# Patient Record
Sex: Female | Born: 1940 | Race: White | Hispanic: No | Marital: Married | Smoking: Never smoker
Health system: Southern US, Community
[De-identification: ages and names within clinical notes are randomized; demographics above are authoritative.]

## PROBLEM LIST (undated history)

## (undated) DIAGNOSIS — R351 Nocturia: Secondary | ICD-10-CM

## (undated) DIAGNOSIS — Q452 Congenital pancreatic cyst: Secondary | ICD-10-CM

## (undated) DIAGNOSIS — Z789 Other specified health status: Secondary | ICD-10-CM

## (undated) DIAGNOSIS — K219 Gastro-esophageal reflux disease without esophagitis: Secondary | ICD-10-CM

## (undated) DIAGNOSIS — M1990 Unspecified osteoarthritis, unspecified site: Secondary | ICD-10-CM

## (undated) DIAGNOSIS — Z960 Presence of urogenital implants: Secondary | ICD-10-CM

## (undated) DIAGNOSIS — M4802 Spinal stenosis, cervical region: Secondary | ICD-10-CM

## (undated) DIAGNOSIS — N189 Chronic kidney disease, unspecified: Secondary | ICD-10-CM

## (undated) DIAGNOSIS — K589 Irritable bowel syndrome without diarrhea: Secondary | ICD-10-CM

## (undated) DIAGNOSIS — N3941 Urge incontinence: Secondary | ICD-10-CM

## (undated) DIAGNOSIS — M810 Age-related osteoporosis without current pathological fracture: Secondary | ICD-10-CM

## (undated) HISTORY — PX: CARPAL TUNNEL RELEASE: SHX101

## (undated) HISTORY — PX: FRACTURE SURGERY: SHX138

## (undated) HISTORY — PX: EYE SURGERY: SHX253

## (undated) HISTORY — PX: TONSILLECTOMY: SUR1361

## (undated) HISTORY — PX: COLONOSCOPY: SHX174

## (undated) HISTORY — PX: OTHER SURGICAL HISTORY: SHX169

---

## 1965-09-28 HISTORY — PX: DILATION AND CURETTAGE OF UTERUS: SHX78

## 1999-09-29 HISTORY — PX: BLADDER SUSPENSION: SHX72

## 2003-09-29 HISTORY — PX: CYSTOCELE REPAIR: SHX163

## 2004-05-15 ENCOUNTER — Ambulatory Visit (HOSPITAL_COMMUNITY): Admission: RE | Admit: 2004-05-15 | Discharge: 2004-05-15 | Payer: Self-pay

## 2004-09-28 HISTORY — PX: CATARACT EXTRACTION W/ INTRAOCULAR LENS IMPLANT: SHX1309

## 2008-03-20 ENCOUNTER — Encounter: Admission: RE | Admit: 2008-03-20 | Discharge: 2008-03-20 | Payer: Self-pay | Admitting: Obstetrics and Gynecology

## 2008-06-20 ENCOUNTER — Ambulatory Visit: Payer: Self-pay | Admitting: Psychology

## 2008-06-20 ENCOUNTER — Encounter: Admission: RE | Admit: 2008-06-20 | Discharge: 2008-06-20 | Payer: Self-pay | Admitting: Neurology

## 2009-03-22 ENCOUNTER — Encounter: Admission: RE | Admit: 2009-03-22 | Discharge: 2009-03-22 | Payer: Self-pay | Admitting: Obstetrics and Gynecology

## 2009-04-28 HISTORY — PX: CARPAL TUNNEL RELEASE: SHX101

## 2010-01-20 ENCOUNTER — Encounter: Admission: RE | Admit: 2010-01-20 | Discharge: 2010-03-17 | Payer: Self-pay | Admitting: Sports Medicine

## 2010-03-26 ENCOUNTER — Encounter: Admission: RE | Admit: 2010-03-26 | Discharge: 2010-03-26 | Payer: Self-pay | Admitting: Obstetrics and Gynecology

## 2011-03-09 ENCOUNTER — Other Ambulatory Visit: Payer: Self-pay | Admitting: Internal Medicine

## 2011-03-09 DIAGNOSIS — Z1231 Encounter for screening mammogram for malignant neoplasm of breast: Secondary | ICD-10-CM

## 2011-04-08 ENCOUNTER — Ambulatory Visit
Admission: RE | Admit: 2011-04-08 | Discharge: 2011-04-08 | Disposition: A | Discharge status: Home / Self Care | Payer: Medicare Other | Source: Ambulatory Visit | Attending: Internal Medicine | Admitting: Internal Medicine

## 2011-04-08 DIAGNOSIS — Z1231 Encounter for screening mammogram for malignant neoplasm of breast: Secondary | ICD-10-CM

## 2011-08-11 DIAGNOSIS — Q452 Congenital pancreatic cyst: Secondary | ICD-10-CM | POA: Insufficient documentation

## 2011-11-09 ENCOUNTER — Other Ambulatory Visit: Payer: Self-pay | Admitting: Orthopedic Surgery

## 2011-11-20 ENCOUNTER — Encounter (HOSPITAL_COMMUNITY): Payer: Self-pay | Admitting: Pharmacy Technician

## 2011-11-20 ENCOUNTER — Encounter (HOSPITAL_COMMUNITY): Payer: Self-pay

## 2011-11-20 ENCOUNTER — Encounter (HOSPITAL_COMMUNITY)
Admission: RE | Admit: 2011-11-20 | Discharge: 2011-11-20 | Disposition: A | Discharge status: Home / Self Care | Payer: Medicare Other | Source: Ambulatory Visit | Attending: Orthopedic Surgery | Admitting: Orthopedic Surgery

## 2011-11-20 HISTORY — DX: Chronic kidney disease, unspecified: N18.9

## 2011-11-20 HISTORY — DX: Unspecified osteoarthritis, unspecified site: M19.90

## 2011-11-20 HISTORY — DX: Irritable bowel syndrome, unspecified: K58.9

## 2011-11-20 LAB — SURGICAL PCR SCREEN
MRSA, PCR: NEGATIVE
Staphylococcus aureus: POSITIVE — AB

## 2011-11-20 MED ORDER — CHLORHEXIDINE GLUCONATE 4 % EX LIQD: 60.0000 mL | Freq: Once | CUTANEOUS | Status: DC

## 2011-11-20 NOTE — Patient Instructions (Signed)
20 Alicia Bradley  11/20/2011   Your procedure is scheduled on:  11/26/11 315pm-515pm  Report to [facility] at 1245pm.  Call this number if you have problems the morning of surgery: [phone]   Remember:   Do not eat food:After Midnight.  May have clear liquids until 0830am then npo     Take these medicines the morning of surgery with A SIP OF WATER:    Do not wear jewelry, make-up or nail polish.  Do not wear lotions, powders, or perfumes.   Do not shave 48 hours prior to surgery.  Do not bring valuables to the hospital.  Contacts, dentures or bridgework may not be worn into surgery.     Patients discharged the day of surgery will not be allowed to drive home.  Name and phone number of your driver:   Special Instructions: CHG Shower Use Special Wash: 1/2 bottle night before surgery and 1/2 bottle morning of surgery. shower chin to toes with CHG.  Wash face and private parts with regular soap.     Please read over the following fact sheets that you were given: MRSA Information, coughing and deep breathing exercises, leg exercises

## 2011-11-23 NOTE — Pre-Procedure Instructions (Signed)
11/23/11 Left message on pt's home phone regarding surgery date now 11/25/11.  Time 600-800pm Arrival to Short Stay is 330pm.  PT may have clear liquids until 1130am.

## 2011-11-25 ENCOUNTER — Ambulatory Visit (HOSPITAL_COMMUNITY)
Admission: RE | Admit: 2011-11-25 | Discharge: 2011-11-25 | Disposition: A | Discharge status: Home / Self Care | Payer: Medicare Other | Source: Ambulatory Visit | Attending: Orthopedic Surgery | Admitting: Orthopedic Surgery

## 2011-11-25 ENCOUNTER — Encounter (HOSPITAL_COMMUNITY): Payer: Self-pay | Admitting: Anesthesiology

## 2011-11-25 ENCOUNTER — Ambulatory Visit (HOSPITAL_COMMUNITY): Payer: Medicare Other | Admitting: Anesthesiology

## 2011-11-25 ENCOUNTER — Encounter (HOSPITAL_COMMUNITY)
Admission: RE | Disposition: A | Discharge status: Home / Self Care | Payer: Self-pay | Source: Ambulatory Visit | Attending: Orthopedic Surgery

## 2011-11-25 DIAGNOSIS — M129 Arthropathy, unspecified: Secondary | ICD-10-CM | POA: Insufficient documentation

## 2011-11-25 DIAGNOSIS — M7060 Trochanteric bursitis, unspecified hip: Secondary | ICD-10-CM

## 2011-11-25 DIAGNOSIS — K589 Irritable bowel syndrome without diarrhea: Secondary | ICD-10-CM | POA: Insufficient documentation

## 2011-11-25 DIAGNOSIS — N189 Chronic kidney disease, unspecified: Secondary | ICD-10-CM | POA: Insufficient documentation

## 2011-11-25 DIAGNOSIS — IMO0002 Reserved for concepts with insufficient information to code with codable children: Secondary | ICD-10-CM | POA: Insufficient documentation

## 2011-11-25 DIAGNOSIS — M76899 Other specified enthesopathies of unspecified lower limb, excluding foot: Secondary | ICD-10-CM | POA: Insufficient documentation

## 2011-11-25 DIAGNOSIS — Z7982 Long term (current) use of aspirin: Secondary | ICD-10-CM | POA: Insufficient documentation

## 2011-11-25 DIAGNOSIS — X58XXXA Exposure to other specified factors, initial encounter: Secondary | ICD-10-CM | POA: Insufficient documentation

## 2011-11-25 DIAGNOSIS — Z79899 Other long term (current) drug therapy: Secondary | ICD-10-CM | POA: Insufficient documentation

## 2011-11-25 DIAGNOSIS — M25559 Pain in unspecified hip: Secondary | ICD-10-CM | POA: Insufficient documentation

## 2011-11-25 HISTORY — PX: EXCISION/RELEASE BURSA HIP: SHX5014

## 2011-11-25 SURGERY — RELEASE, BURSA, TROCHANTERIC
Anesthesia: General | Site: Hip | Laterality: Right | Wound class: Clean

## 2011-11-25 MED ORDER — NEOSTIGMINE METHYLSULFATE 1 MG/ML IJ SOLN
INTRAMUSCULAR | Status: DC | PRN
  Administered 2011-11-25: 4 mg via INTRAVENOUS

## 2011-11-25 MED ORDER — BUPIVACAINE LIPOSOME 1.3 % IJ SUSP
20.0000 mL | INTRAMUSCULAR | Status: DC
  Filled 2011-11-25: qty 20

## 2011-11-25 MED ORDER — CEFAZOLIN SODIUM 1-5 GM-% IV SOLN
1.0000 g | INTRAVENOUS | Status: AC
  Administered 2011-11-25: 1 g via INTRAVENOUS

## 2011-11-25 MED ORDER — OXYCODONE-ACETAMINOPHEN 5-325 MG PO TABS: 1.0000 | ORAL_TABLET | ORAL | Status: AC | PRN

## 2011-11-25 MED ORDER — MEPERIDINE HCL 25 MG/ML IJ SOLN
6.2500 mg | INTRAMUSCULAR | Status: DC | PRN
Start: 2011-11-25 — End: 2011-11-25

## 2011-11-25 MED ORDER — FENTANYL CITRATE 0.05 MG/ML IJ SOLN
INTRAMUSCULAR | Status: DC | PRN
  Administered 2011-11-25: 100 ug via INTRAVENOUS
  Administered 2011-11-25 (×3): 50 ug via INTRAVENOUS

## 2011-11-25 MED ORDER — BUPIVACAINE LIPOSOME 1.3 % IJ SUSP
INTRAMUSCULAR | Status: DC | PRN
  Administered 2011-11-25: 20 mL

## 2011-11-25 MED ORDER — SODIUM CHLORIDE 0.9 % IV SOLN: INTRAVENOUS | Status: DC

## 2011-11-25 MED ORDER — PROPOFOL 10 MG/ML IV BOLUS
INTRAVENOUS | Status: DC | PRN
  Administered 2011-11-25: 100 mg via INTRAVENOUS

## 2011-11-25 MED ORDER — ROCURONIUM BROMIDE 100 MG/10ML IV SOLN
INTRAVENOUS | Status: DC | PRN
  Administered 2011-11-25: 25 mg via INTRAVENOUS

## 2011-11-25 MED ORDER — SODIUM CHLORIDE 0.9 % IJ SOLN
INTRAMUSCULAR | Status: DC | PRN
  Administered 2011-11-25: 50 mL

## 2011-11-25 MED ORDER — DEXAMETHASONE SODIUM PHOSPHATE 10 MG/ML IJ SOLN
10.0000 mg | INTRAMUSCULAR | Status: DC
  Filled 2011-11-25: qty 1

## 2011-11-25 MED ORDER — ACETAMINOPHEN 10 MG/ML IV SOLN
INTRAVENOUS | Status: AC
  Filled 2011-11-25: qty 100

## 2011-11-25 MED ORDER — ONDANSETRON HCL 4 MG/2ML IJ SOLN
INTRAMUSCULAR | Status: DC | PRN
  Administered 2011-11-25: 4 mg via INTRAVENOUS

## 2011-11-25 MED ORDER — HYDROMORPHONE HCL PF 1 MG/ML IJ SOLN: 0.2500 mg | INTRAMUSCULAR | Status: DC | PRN

## 2011-11-25 MED ORDER — LACTATED RINGERS IV SOLN
INTRAVENOUS | Status: DC | PRN
  Administered 2011-11-25: 17:00:00 via INTRAVENOUS

## 2011-11-25 MED ORDER — OXYCODONE-ACETAMINOPHEN 5-325 MG PO TABS
1.0000 | ORAL_TABLET | ORAL | Status: AC | PRN
  Administered 2011-11-25: 1 via ORAL

## 2011-11-25 MED ORDER — METHOCARBAMOL 500 MG PO TABS: 500.0000 mg | ORAL_TABLET | Freq: Four times a day (QID) | ORAL | Status: AC

## 2011-11-25 MED ORDER — ACETAMINOPHEN 10 MG/ML IV SOLN
1000.0000 mg | Freq: Once | INTRAVENOUS | Status: AC
  Administered 2011-11-25: 1000 mg via INTRAVENOUS

## 2011-11-25 MED ORDER — GLYCOPYRROLATE 0.2 MG/ML IJ SOLN
INTRAMUSCULAR | Status: DC | PRN
  Administered 2011-11-25: .5 mg via INTRAVENOUS

## 2011-11-25 MED ORDER — LACTATED RINGERS IV SOLN: INTRAVENOUS | Status: DC

## 2011-11-25 MED ORDER — CEFAZOLIN SODIUM 1-5 GM-% IV SOLN
INTRAVENOUS | Status: AC
  Filled 2011-11-25: qty 50

## 2011-11-25 MED ORDER — OXYCODONE-ACETAMINOPHEN 5-325 MG PO TABS
ORAL_TABLET | ORAL | Status: AC
  Administered 2011-11-25: 1 via ORAL
  Filled 2011-11-25: qty 1

## 2011-11-25 MED ORDER — PROMETHAZINE HCL 25 MG/ML IJ SOLN: 6.2500 mg | INTRAMUSCULAR | Status: DC | PRN

## 2011-11-25 SURGICAL SUPPLY — 36 items
ANCHOR SUPER QUICK (Anchor) ×2 IMPLANT
BAG ZIPLOCK 12X15 (MISCELLANEOUS) ×2 IMPLANT
BLADE EXTENDED COATED 6.5IN (ELECTRODE) IMPLANT
CLOTH BEACON ORANGE TIMEOUT ST (SAFETY) ×2 IMPLANT
DRAPE INCISE IOBAN 66X45 STRL (DRAPES) ×2 IMPLANT
DRAPE ORTHO SPLIT 77X108 STRL (DRAPES) ×2
DRAPE POUCH INSTRU U-SHP 10X18 (DRAPES) ×2 IMPLANT
DRAPE SURG ORHT 6 SPLT 77X108 (DRAPES) ×2 IMPLANT
DRAPE U-SHAPE 47X51 STRL (DRAPES) ×2 IMPLANT
DRSG ADAPTIC 3X8 NADH LF (GAUZE/BANDAGES/DRESSINGS) ×2 IMPLANT
DRSG MEPILEX BORDER 4X4 (GAUZE/BANDAGES/DRESSINGS) IMPLANT
DRSG MEPILEX BORDER 4X8 (GAUZE/BANDAGES/DRESSINGS) ×2 IMPLANT
ELECT BLADE TIP CTD 4 INCH (ELECTRODE) ×2 IMPLANT
ELECT REM PT RETURN 9FT ADLT (ELECTROSURGICAL) ×2
ELECTRODE REM PT RTRN 9FT ADLT (ELECTROSURGICAL) ×1 IMPLANT
GAUZE SPONGE 4X4 12PLY STRL LF (GAUZE/BANDAGES/DRESSINGS) ×2 IMPLANT
GLOVE BIO SURGEON STRL SZ7.5 (GLOVE) ×2 IMPLANT
GLOVE BIO SURGEON STRL SZ8 (GLOVE) ×4 IMPLANT
GOWN STRL NON-REIN LRG LVL3 (GOWN DISPOSABLE) ×2 IMPLANT
GOWN STRL REIN XL XLG (GOWN DISPOSABLE) ×2 IMPLANT
IV SET HUBERPLUS 22X1 SAFETY (NEEDLE) ×2 IMPLANT
MANIFOLD NEPTUNE II (INSTRUMENTS) ×2 IMPLANT
NS IRRIG 1000ML POUR BTL (IV SOLUTION) ×2 IMPLANT
PACK TOTAL JOINT (CUSTOM PROCEDURE TRAY) ×2 IMPLANT
POSITIONER SURGICAL ARM (MISCELLANEOUS) ×2 IMPLANT
SPONGE GAUZE 4X4 12PLY (GAUZE/BANDAGES/DRESSINGS) ×2 IMPLANT
STAPLER VISISTAT 35W (STAPLE) ×2 IMPLANT
STRIP CLOSURE SKIN 1/2X4 (GAUZE/BANDAGES/DRESSINGS) ×2 IMPLANT
SUT MNCRL AB 4-0 PS2 18 (SUTURE) ×2 IMPLANT
SUT VIC AB 1 CT1 27 (SUTURE) ×3
SUT VIC AB 1 CT1 27XBRD ANTBC (SUTURE) ×3 IMPLANT
SUT VIC AB 2-0 CT1 27 (SUTURE) ×3
SUT VIC AB 2-0 CT1 TAPERPNT 27 (SUTURE) ×3 IMPLANT
SYR 30ML LL (SYRINGE) ×2 IMPLANT
TOWEL OR 17X26 10 PK STRL BLUE (TOWEL DISPOSABLE) ×4 IMPLANT
WATER STERILE IRR 1500ML POUR (IV SOLUTION) ×2 IMPLANT

## 2011-11-25 NOTE — Interval H&P Note (Signed)
History and Physical Interval Note:  11/25/2011 4:32 PM  Alicia Bradley  has presented today for surgery, with the diagnosis of Right hip bursitis  The various methods of treatment have been discussed with the patient and family. After consideration of risks, benefits and other options for treatment, the patient has consented to  Procedure(s) (LRB): EXCISION/RELEASE BURSA HIP (Right) OPEN SURGICAL REPAIR OF GLUTEAL TENDON (Right) as a surgical intervention .  The patients' history has been reviewed, patient examined, no change in status, stable for surgery.  I have reviewed the patients' chart and labs.  Questions were answered to the patient's satisfaction.     Loanne Drilling

## 2011-11-25 NOTE — Transfer of Care (Signed)
Immediate Anesthesia Transfer of Care Note  Patient: Alicia Bradley  Procedure(s) Performed: Procedure(s) (LRB): EXCISION/RELEASE BURSA HIP (Right) OPEN SURGICAL REPAIR OF GLUTEAL TENDON (Right)  Patient Location: PACU  Anesthesia Type: General  Level of Consciousness: awake, alert  and patient cooperative  Airway & Oxygen Therapy: Patient Spontanous Breathing and Patient connected to face mask oxygen  Post-op Assessment: Report given to PACU RN and Post -op Vital signs reviewed and stable  Post vital signs: Reviewed and stable  Complications: No apparent anesthesia complications

## 2011-11-25 NOTE — Anesthesia Postprocedure Evaluation (Signed)
  Anesthesia Post-op Note  Patient: Alicia Bradley  Procedure(s) Performed: Procedure(s) (LRB): EXCISION/RELEASE BURSA HIP (Right) OPEN SURGICAL REPAIR OF GLUTEAL TENDON (Right)  Patient Location: PACU  Anesthesia Type: General  Level of Consciousness: awake and alert   Airway and Oxygen Therapy: Patient Spontanous Breathing  Post-op Pain: mild  Post-op Assessment: Post-op Vital signs reviewed, Patient's Cardiovascular Status Stable, Respiratory Function Stable, Patent Airway and No signs of Nausea or vomiting  Post-op Vital Signs: stable  Complications: No apparent anesthesia complications

## 2011-11-25 NOTE — H&P (Signed)
CC- Alicia Bradley is a 71 y.o. female who presents with right hip pain  Hip Pain: Patient complains of right hip pain. Onset of the symptoms was several months ago. Inciting event: none. Current symptoms include lateral pain. Associated symptoms: none. Aggravating symptoms: rising after sitting and lying on right side. Patient's course of pain: gradually worsening. Patient has had no prior hip problems.  Evaluation to date: plain films, which were normal.  Treatment to date: physical therapy, which has been not very effective.  Past Medical History  Diagnosis Date  . Chronic kidney disease     nocturia   . 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 Irritable bowel syndrome     Past Surgical History  Procedure Date  . Miscarriage    . Cesarean section     x 2  . Other surgical history     transvaginal sling and cystocele   . Other surgical history     vaginal correction and cystocele   . Eye surgery     bilateral cataract surgery with lens   . Other surgical history     root canals x 3   . Carpal tunnel release     right wrist   . Other surgical history     cubital tunnel on right elbow   . Fracture surgery     fifth metacarpal   . Mrcp      fall / 2012 - [facility]     Prior to Admission medications   Medication Sig Start Date End Date Taking? Authorizing Provider  Alpha-D-Galactosidase (BEANO PO) Take 1 tablet by mouth 3 (three) times daily as needed. gas    Historical Provider, MD  aspirin EC 81 MG tablet Take 81 mg by mouth every other day.    Historical Provider, MD  calcium carbonate (TUMS - DOSED IN MG ELEMENTAL CALCIUM) 500 MG chewable tablet Chew 1 tablet by mouth 3 (three) times daily.    Historical Provider, MD  celecoxib (CELEBREX) 200 MG capsule Take 200 mg by mouth daily as needed. Pain    Historical Provider, MD  cetirizine (ZYRTEC) 10 MG tablet Take 10 mg by mouth daily as needed. Allergies     Historical Provider, MD  citalopram (CELEXA) 20 MG tablet Take 20  mg by mouth at bedtime.    Historical Provider, MD  DIGESTIVE ENZYMES PO Take 1 capsule by mouth daily.    Historical Provider, MD  estradiol-norethindrone (ACTIVELLA) 1-0.5 MG per tablet Take 1 tablet by mouth daily.    Historical Provider, MD  lipase/protease/amylase (CREON-10/PANCREASE) 12000 UNITS CPEP Take 1 capsule by mouth daily.    Historical Provider, MD  Multiple Vitamin (MULITIVITAMIN WITH MINERALS) TABS Take 1 tablet by mouth daily.    Historical Provider, MD  naproxen sodium (ANAPROX) 220 MG tablet Take 440 mg by mouth 2 (two) times daily as needed. Pain    Historical Provider, MD  omeprazole (PRILOSEC) 20 MG capsule Take 20 mg by mouth daily.    Historical Provider, MD  OVER THE COUNTER MEDICATION Take 1 tablet by mouth daily. Suprema     Historical Provider, MD  Probiotic Product (ALIGN) 4 MG CAPS Take 4 mg by mouth daily.    Historical Provider, MD  Simethicone (PHAZYME PO) Take 1 tablet by mouth 3 (three) times daily as needed. Gas    Historical Provider, MD  simvastatin (ZOCOR) 20 MG tablet Take 20 mg by mouth every evening.    Historical Provider, MD  VITAMIN D, CHOLECALCIFEROL, PO Take 1 tablet by mouth daily.    Historical Provider, MD    Physical Examination: General appearance - alert, well appearing, and in no distress Mental status - alert, oriented to person, place, and time Chest - clear to auscultation, no wheezes, rales or rhonchi, symmetric air entry Heart - normal rate, regular rhythm, normal S1, S2, no murmurs, rubs, clicks or gallops Abdomen - soft, nontender, nondistended, no masses or organomegaly Neurological - alert, oriented, normal speech, no focal findings or movement disorder noted  A right hip exam was performed. GENERAL: no acute distress TENDERNESS: over greater trochanter ROM: normal STRENGTH: normal GAIT: antalgic  ASSESSMENT: Intractable right hip bursitis with possible gluteal tendon tear  Plan- Right hip bursectomy with possible gluteal  tendon repair. Procedure, risks and potential complications discussed with patient who elects to proceed. Goals are decreased pain and improved function, both of which should be achieved.  Gus Rankin Celes Dedic, MD    11/25/2011, 7:19 AM

## 2011-11-25 NOTE — Anesthesia Preprocedure Evaluation (Addendum)
Anesthesia Evaluation  Patient identified by MRN, date of birth, ID band Patient awake    Reviewed: Allergy & Precautions, H&P , NPO status , Patient's Chart, lab work & pertinent test results  Airway Mallampati: II TM Distance: >3 FB Neck ROM: Full    Dental No notable dental hx.    Pulmonary neg pulmonary ROS,  clear to auscultation  Pulmonary exam normal       Cardiovascular neg cardio ROS Regular Normal    Neuro/Psych Negative Neurological ROS  Negative Psych ROS   GI/Hepatic negative GI ROS, Neg liver ROS,   Endo/Other  Negative Endocrine ROS  Renal/GU negative Renal ROS  Genitourinary negative   Musculoskeletal negative musculoskeletal ROS (+)   Abdominal   Peds negative pediatric ROS (+)  Hematology negative hematology ROS (+)   Anesthesia Other Findings   Reproductive/Obstetrics negative OB ROS                           Anesthesia Physical Anesthesia Plan  ASA: II  Anesthesia Plan: General   Post-op Pain Management:    Induction: Intravenous  Airway Management Planned:   Additional Equipment:   Intra-op Plan:   Post-operative Plan: Extubation in OR  Informed Consent: I have reviewed the patients History and Physical, chart, labs and discussed the procedure including the risks, benefits and alternatives for the proposed anesthesia with the patient or authorized representative who has indicated his/her understanding and acceptance.   Dental advisory given  Plan Discussed with: CRNA  Anesthesia Plan Comments:         Anesthesia Quick Evaluation  

## 2011-11-25 NOTE — Brief Op Note (Signed)
11/25/2011  6:03 PM  PATIENT:  Alicia Bradley  71 y.o. female  PRE-OPERATIVE DIAGNOSIS:  Right hip bursitis  POST-OPERATIVE DIAGNOSIS:  right hip bursitis  PROCEDURE:  Procedure(s) (LRB): EXCISION/RELEASE BURSA HIP (Right) OPEN SURGICAL REPAIR OF GLUTEAL TENDON (Right)  SURGEON:  Surgeon(s) and Role:    * Loanne Drilling, MD - Primary  PHYSICIAN ASSISTANT:   ASSISTANTS: Dimitri Ped, PA-C   ANESTHESIA:   general  EBL:  Total I/O In: 700 [I.V.:700] Out: 50 [Blood:50]  BLOOD ADMINISTERED:none  DRAINS: none   LOCAL MEDICATIONS USED:  OTHER Exparel 20mg  mixed with 50 ml Saline  COUNTS:  YES  TOURNIQUET:  * No tourniquets in log *  DICTATION: .Other Dictation: Dictation Number [id number]  PLAN OF CARE: Discharge to home after PACU  PATIENT DISPOSITION:  PACU - hemodynamically stable.   Gus Rankin Maui Britten, MD    11/25/2011, 6:08 PM

## 2011-11-26 NOTE — Op Note (Signed)
Alicia Bradley, Alicia Bradley                ACCOUNT NO.:  [id number]  MEDICAL RECORD NO.:  [id number]  LOCATION:  [facility]                         FACILITY:  [facility]  PHYSICIAN:  Ollen Gross, M.D.    DATE OF BIRTH:  02-May-1941  DATE OF PROCEDURE:  11/25/2011 DATE OF DISCHARGE:  11/25/2011                              OPERATIVE REPORT   PREOPERATIVE DIAGNOSIS:  Right hip intractable bursitis with possible gluteal tendon tear.  POSTOPERATIVE DIAGNOSIS:  Right hip intractable bursitis with possible gluteal tendon tear.  PROCEDURE:  Right hip bursectomy with gluteal tendon repair.  SURGEON:  Ollen Gross, M.D.  ASSISTANT:  Dimitri Ped, PA-C.  ANESTHESIA:  General.  ESTIMATED BLOOD LOSS:  Minimal.  DRAINS:  None.  COMPLICATIONS:  None.  CONDITION:  Stable to Recovery.  BRIEF CLINICAL NOTE:  Alicia Bradley is a 71 year old female who has had a long history of severe right lateral hip pain, which has been intractable.  She has had several injections as well as a physical therapy without benefit.  Exam and history suggests she may have a gluteal tendon tear in addition to the bursitis.  This was not confirmed by MRI, but given her intractable pain and lack of response to other modalities, it was felt now that she has indicated to remove the bursa and explore the tendon.  The tendon was torn, we really repaired it. She presents now for the above-mentioned procedure.  PROCEDURE IN DETAIL:  After successful administration of general anesthetic, the patient was placed in left lateral decubitus position with the right side up and held with the hip positioner.  Right lower extremity was isolated from her perineum with plastic drapes and prepped and draped in usual sterile fashion.  The short lateral incision was made centered over the tip of the greater trochanter __________ 4 inch incision all together.  Skin was cut with a 10 blade through subcutaneous tissue to the level of the fascia  lata, which was incised in line with the skin incision.  There was evidence of a very enlarged, thickened bursa full of fluid.  I excised the bursa.  I rotated the leg internally and externally to make sure we removed the entire extent of the bursa.  The gluteus medius tendon was torn off the greater trochanter.  It was retracted about a cm.  I freshened up the edge of the tendon.  A 4 pronged Mitek anchor was impacted into the tip of the greater trochanter and the sutures were passed through the leading edge of the tendon, which was advanced down onto the trochanter and was oversewn and tied.  This was a very stable repair.  I palpated the rest of the tendon and it was intact throughout.  We inspected again, there was no other bursa remaining.  Meticulous hemostasis was achieved.  The wound was copiously irrigated with saline solution.  The fascia lata was then closed with interrupted #1 Vicryl.  I left open a triangular area over the tip of the greater trochanter, so would not cause friction or cause any recurrent bursal formation.  Further irrigation was performed. 20 mL of Exparel mixed with 50 mL of saline were injected into the  gluteus musculature, the fascia lata, and the subcutaneous tissues. Subcu was closed with interrupted #1, interrupted 2-0 Vicryl, and subcuticular running 4-0 Monocryl.  Incision was cleaned and dried, and Steri-Strips and a bulky sterile dressing applied.  She was awakened and transported to Recovery in stable condition.     Ollen Gross, M.D.     FA/MEDQ  D:  11/25/2011  T:  11/26/2011  Job:  [id number]

## 2011-11-27 ENCOUNTER — Encounter (HOSPITAL_COMMUNITY): Payer: Self-pay | Admitting: Orthopedic Surgery

## 2012-01-14 ENCOUNTER — Ambulatory Visit: Payer: Medicare Other | Attending: Orthopedic Surgery

## 2012-01-14 DIAGNOSIS — IMO0001 Reserved for inherently not codable concepts without codable children: Secondary | ICD-10-CM | POA: Insufficient documentation

## 2012-01-14 DIAGNOSIS — M6281 Muscle weakness (generalized): Secondary | ICD-10-CM | POA: Insufficient documentation

## 2012-01-14 DIAGNOSIS — M25559 Pain in unspecified hip: Secondary | ICD-10-CM | POA: Insufficient documentation

## 2012-01-14 DIAGNOSIS — R269 Unspecified abnormalities of gait and mobility: Secondary | ICD-10-CM | POA: Insufficient documentation

## 2012-01-20 ENCOUNTER — Ambulatory Visit: Payer: Medicare Other | Admitting: Physical Therapy

## 2012-01-21 ENCOUNTER — Ambulatory Visit: Payer: Medicare Other | Admitting: Physical Therapy

## 2012-01-26 ENCOUNTER — Ambulatory Visit: Payer: Medicare Other | Admitting: Physical Therapy

## 2012-01-27 DIAGNOSIS — Z0271 Encounter for disability determination: Secondary | ICD-10-CM

## 2012-01-28 ENCOUNTER — Encounter: Payer: Medicare Other | Admitting: Physical Therapy

## 2012-01-28 ENCOUNTER — Ambulatory Visit: Payer: Medicare Other | Attending: Orthopedic Surgery | Admitting: Physical Therapy

## 2012-01-28 DIAGNOSIS — R269 Unspecified abnormalities of gait and mobility: Secondary | ICD-10-CM | POA: Insufficient documentation

## 2012-01-28 DIAGNOSIS — M6281 Muscle weakness (generalized): Secondary | ICD-10-CM | POA: Insufficient documentation

## 2012-01-28 DIAGNOSIS — IMO0001 Reserved for inherently not codable concepts without codable children: Secondary | ICD-10-CM | POA: Insufficient documentation

## 2012-01-28 DIAGNOSIS — M25559 Pain in unspecified hip: Secondary | ICD-10-CM | POA: Insufficient documentation

## 2012-02-02 ENCOUNTER — Ambulatory Visit: Payer: Medicare Other | Admitting: Physical Therapy

## 2012-02-02 ENCOUNTER — Encounter: Payer: Medicare Other | Admitting: Physical Therapy

## 2012-02-03 ENCOUNTER — Ambulatory Visit: Payer: Medicare Other | Admitting: Physical Therapy

## 2012-02-03 ENCOUNTER — Encounter: Payer: Medicare Other | Admitting: Physical Therapy

## 2012-02-04 ENCOUNTER — Encounter: Payer: Medicare Other | Admitting: Physical Therapy

## 2012-02-09 ENCOUNTER — Ambulatory Visit: Payer: Medicare Other | Admitting: Physical Therapy

## 2012-02-09 ENCOUNTER — Encounter: Payer: Medicare Other | Admitting: Physical Therapy

## 2012-02-11 ENCOUNTER — Encounter: Payer: Medicare Other | Admitting: Physical Therapy

## 2012-02-12 ENCOUNTER — Ambulatory Visit: Payer: Medicare Other | Admitting: Physical Therapy

## 2012-05-12 ENCOUNTER — Ambulatory Visit: Payer: Medicare Other | Attending: Orthopedic Surgery | Admitting: Physical Therapy

## 2012-05-12 DIAGNOSIS — IMO0001 Reserved for inherently not codable concepts without codable children: Secondary | ICD-10-CM | POA: Insufficient documentation

## 2012-05-12 DIAGNOSIS — R269 Unspecified abnormalities of gait and mobility: Secondary | ICD-10-CM | POA: Insufficient documentation

## 2012-05-12 DIAGNOSIS — M6281 Muscle weakness (generalized): Secondary | ICD-10-CM | POA: Insufficient documentation

## 2012-05-12 DIAGNOSIS — M25559 Pain in unspecified hip: Secondary | ICD-10-CM | POA: Insufficient documentation

## 2012-05-13 ENCOUNTER — Ambulatory Visit (INDEPENDENT_AMBULATORY_CARE_PROVIDER_SITE_OTHER): Payer: Medicare Other | Admitting: Emergency Medicine

## 2012-05-13 ENCOUNTER — Ambulatory Visit: Payer: Medicare Other

## 2012-05-13 VITALS — BP 122/76 | HR 72 | Temp 98.2°F | Resp 16 | Ht 63.0 in | Wt 143.8 lb

## 2012-05-13 DIAGNOSIS — M542 Cervicalgia: Secondary | ICD-10-CM

## 2012-05-13 DIAGNOSIS — S139XXA Sprain of joints and ligaments of unspecified parts of neck, initial encounter: Secondary | ICD-10-CM

## 2012-05-13 MED ORDER — CYCLOBENZAPRINE HCL 5 MG PO TABS: 10.0000 mg | ORAL_TABLET | Freq: Three times a day (TID) | ORAL | Status: AC | PRN

## 2012-05-13 MED ORDER — NAPROXEN SODIUM 275 MG PO TABS: 275.0000 mg | ORAL_TABLET | Freq: Three times a day (TID) | ORAL | Status: AC

## 2012-05-13 NOTE — Patient Instructions (Signed)

## 2012-05-13 NOTE — Addendum Note (Signed)
Addended by: Carmelina Dane on: 05/13/2012 05:57 PM   Modules accepted: Orders
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
## 2012-05-16 ENCOUNTER — Other Ambulatory Visit: Payer: Self-pay | Admitting: Internal Medicine

## 2012-05-16 DIAGNOSIS — Z1231 Encounter for screening mammogram for malignant neoplasm of breast: Secondary | ICD-10-CM

## 2012-05-17 ENCOUNTER — Ambulatory Visit: Payer: Medicare Other | Admitting: Physical Therapy

## 2012-05-19 ENCOUNTER — Ambulatory Visit: Payer: Medicare Other | Admitting: Physical Therapy

## 2012-05-24 ENCOUNTER — Ambulatory Visit: Payer: Medicare Other | Admitting: Physical Therapy

## 2012-05-26 ENCOUNTER — Ambulatory Visit: Payer: Medicare Other | Admitting: Physical Therapy

## 2012-05-31 ENCOUNTER — Ambulatory Visit: Payer: Medicare Other | Attending: Orthopedic Surgery | Admitting: Physical Therapy

## 2012-05-31 DIAGNOSIS — IMO0001 Reserved for inherently not codable concepts without codable children: Secondary | ICD-10-CM | POA: Insufficient documentation

## 2012-05-31 DIAGNOSIS — R269 Unspecified abnormalities of gait and mobility: Secondary | ICD-10-CM | POA: Insufficient documentation

## 2012-05-31 DIAGNOSIS — M2560 Stiffness of unspecified joint, not elsewhere classified: Secondary | ICD-10-CM | POA: Insufficient documentation

## 2012-05-31 DIAGNOSIS — M6281 Muscle weakness (generalized): Secondary | ICD-10-CM | POA: Insufficient documentation

## 2012-05-31 DIAGNOSIS — M25559 Pain in unspecified hip: Secondary | ICD-10-CM | POA: Insufficient documentation

## 2012-05-31 DIAGNOSIS — M542 Cervicalgia: Secondary | ICD-10-CM | POA: Insufficient documentation

## 2012-06-02 ENCOUNTER — Ambulatory Visit: Payer: Medicare Other | Admitting: Physical Therapy

## 2012-06-02 ENCOUNTER — Encounter: Payer: Medicare Other | Admitting: Physical Therapy

## 2012-06-03 ENCOUNTER — Ambulatory Visit: Payer: Medicare Other | Admitting: Physical Therapy

## 2012-06-03 ENCOUNTER — Ambulatory Visit
Admission: RE | Admit: 2012-06-03 | Discharge: 2012-06-03 | Disposition: A | Discharge status: Home / Self Care | Payer: Medicare Other | Source: Ambulatory Visit | Attending: Internal Medicine | Admitting: Internal Medicine

## 2012-06-03 DIAGNOSIS — Z1231 Encounter for screening mammogram for malignant neoplasm of breast: Secondary | ICD-10-CM

## 2012-06-07 ENCOUNTER — Ambulatory Visit: Payer: Medicare Other | Admitting: Physical Therapy

## 2012-06-09 ENCOUNTER — Ambulatory Visit: Payer: Medicare Other | Admitting: Physical Therapy

## 2012-06-14 ENCOUNTER — Ambulatory Visit: Payer: Medicare Other | Admitting: Physical Therapy

## 2012-06-16 ENCOUNTER — Ambulatory Visit: Payer: Medicare Other | Admitting: Physical Therapy

## 2012-06-23 ENCOUNTER — Ambulatory Visit: Payer: Medicare Other | Admitting: Physical Therapy

## 2012-06-28 ENCOUNTER — Ambulatory Visit: Payer: Medicare Other | Attending: Orthopedic Surgery | Admitting: Physical Therapy

## 2012-06-28 DIAGNOSIS — IMO0001 Reserved for inherently not codable concepts without codable children: Secondary | ICD-10-CM | POA: Insufficient documentation

## 2012-06-28 DIAGNOSIS — M6281 Muscle weakness (generalized): Secondary | ICD-10-CM | POA: Insufficient documentation

## 2012-06-28 DIAGNOSIS — M25559 Pain in unspecified hip: Secondary | ICD-10-CM | POA: Insufficient documentation

## 2012-06-28 DIAGNOSIS — R269 Unspecified abnormalities of gait and mobility: Secondary | ICD-10-CM | POA: Insufficient documentation

## 2012-06-30 ENCOUNTER — Ambulatory Visit: Payer: Medicare Other | Admitting: Physical Therapy

## 2012-06-30 ENCOUNTER — Encounter: Payer: Medicare Other | Admitting: Physical Therapy

## 2012-07-12 ENCOUNTER — Ambulatory Visit: Payer: Medicare Other | Attending: Orthopedic Surgery | Admitting: Physical Therapy

## 2012-07-12 DIAGNOSIS — M2560 Stiffness of unspecified joint, not elsewhere classified: Secondary | ICD-10-CM | POA: Insufficient documentation

## 2012-07-12 DIAGNOSIS — M542 Cervicalgia: Secondary | ICD-10-CM | POA: Insufficient documentation

## 2012-07-12 DIAGNOSIS — IMO0001 Reserved for inherently not codable concepts without codable children: Secondary | ICD-10-CM | POA: Insufficient documentation

## 2012-07-14 ENCOUNTER — Ambulatory Visit: Payer: Medicare Other | Admitting: Physical Therapy

## 2013-05-22 ENCOUNTER — Other Ambulatory Visit (HOSPITAL_COMMUNITY): Payer: Self-pay | Admitting: *Deleted

## 2013-05-23 ENCOUNTER — Encounter (HOSPITAL_COMMUNITY)
Admission: RE | Admit: 2013-05-23 | Discharge: 2013-05-23 | Disposition: A | Discharge status: Home / Self Care | Payer: Medicare Other | Source: Ambulatory Visit | Attending: Internal Medicine | Admitting: Internal Medicine

## 2013-05-23 DIAGNOSIS — M810 Age-related osteoporosis without current pathological fracture: Secondary | ICD-10-CM | POA: Insufficient documentation

## 2013-05-23 MED ORDER — ZOLEDRONIC ACID 5 MG/100ML IV SOLN
INTRAVENOUS | Status: AC
  Filled 2013-05-23: qty 100

## 2013-05-23 MED ORDER — ZOLEDRONIC ACID 5 MG/100ML IV SOLN
5.0000 mg | Freq: Once | INTRAVENOUS | Status: AC
  Administered 2013-05-23: 5 mg via INTRAVENOUS

## 2013-11-27 DIAGNOSIS — H04129 Dry eye syndrome of unspecified lacrimal gland: Secondary | ICD-10-CM | POA: Insufficient documentation

## 2013-11-27 DIAGNOSIS — H02409 Unspecified ptosis of unspecified eyelid: Secondary | ICD-10-CM | POA: Insufficient documentation

## 2013-11-27 DIAGNOSIS — L918 Other hypertrophic disorders of the skin: Secondary | ICD-10-CM | POA: Insufficient documentation

## 2013-11-27 DIAGNOSIS — H40003 Preglaucoma, unspecified, bilateral: Secondary | ICD-10-CM | POA: Insufficient documentation

## 2013-11-27 DIAGNOSIS — H5340 Unspecified visual field defects: Secondary | ICD-10-CM | POA: Insufficient documentation

## 2013-11-27 DIAGNOSIS — L908 Other atrophic disorders of skin: Secondary | ICD-10-CM | POA: Insufficient documentation

## 2013-12-27 HISTORY — PX: BLEPHAROPLASTY W/ LASER: SHX1243

## 2014-01-12 DIAGNOSIS — H53459 Other localized visual field defect, unspecified eye: Secondary | ICD-10-CM | POA: Insufficient documentation

## 2014-01-12 DIAGNOSIS — H02834 Dermatochalasis of left upper eyelid: Secondary | ICD-10-CM | POA: Insufficient documentation

## 2014-01-12 DIAGNOSIS — H02831 Dermatochalasis of right upper eyelid: Secondary | ICD-10-CM | POA: Insufficient documentation

## 2014-05-31 ENCOUNTER — Encounter (HOSPITAL_COMMUNITY): Payer: Self-pay

## 2014-05-31 ENCOUNTER — Ambulatory Visit (HOSPITAL_COMMUNITY)
Admission: RE | Admit: 2014-05-31 | Discharge: 2014-05-31 | Disposition: A | Discharge status: Home / Self Care | Payer: Medicare HMO | Source: Ambulatory Visit | Attending: Internal Medicine | Admitting: Internal Medicine

## 2014-05-31 ENCOUNTER — Other Ambulatory Visit (HOSPITAL_COMMUNITY): Payer: Self-pay | Admitting: Internal Medicine

## 2014-05-31 DIAGNOSIS — M810 Age-related osteoporosis without current pathological fracture: Secondary | ICD-10-CM | POA: Insufficient documentation

## 2014-05-31 HISTORY — DX: Age-related osteoporosis without current pathological fracture: M81.0

## 2014-05-31 MED ORDER — SODIUM CHLORIDE 0.9 % IV SOLN
INTRAVENOUS | Status: DC
  Administered 2014-05-31: 13:00:00 via INTRAVENOUS

## 2014-05-31 MED ORDER — ZOLEDRONIC ACID 5 MG/100ML IV SOLN
5.0000 mg | Freq: Once | INTRAVENOUS | Status: AC
  Administered 2014-05-31: 5 mg via INTRAVENOUS
  Filled 2014-05-31: qty 100

## 2014-05-31 NOTE — Discharge Instructions (Signed)

## 2014-08-15 ENCOUNTER — Other Ambulatory Visit: Payer: Self-pay | Admitting: Internal Medicine

## 2014-08-15 DIAGNOSIS — Z1231 Encounter for screening mammogram for malignant neoplasm of breast: Secondary | ICD-10-CM

## 2014-08-30 ENCOUNTER — Ambulatory Visit: Payer: Medicare Other

## 2014-09-11 ENCOUNTER — Ambulatory Visit
Admission: RE | Admit: 2014-09-11 | Discharge: 2014-09-11 | Disposition: A | Discharge status: Home / Self Care | Payer: Medicare HMO | Source: Ambulatory Visit | Attending: Internal Medicine | Admitting: Internal Medicine

## 2014-09-11 DIAGNOSIS — Z1231 Encounter for screening mammogram for malignant neoplasm of breast: Secondary | ICD-10-CM

## 2015-06-12 ENCOUNTER — Other Ambulatory Visit (HOSPITAL_COMMUNITY): Payer: Self-pay | Admitting: Internal Medicine

## 2015-06-12 ENCOUNTER — Ambulatory Visit (HOSPITAL_COMMUNITY)
Admission: RE | Admit: 2015-06-12 | Discharge: 2015-06-12 | Disposition: A | Discharge status: Home / Self Care | Payer: Medicare HMO | Source: Ambulatory Visit | Attending: Internal Medicine | Admitting: Internal Medicine

## 2015-06-12 ENCOUNTER — Encounter (HOSPITAL_COMMUNITY): Payer: Self-pay

## 2015-06-12 DIAGNOSIS — M810 Age-related osteoporosis without current pathological fracture: Secondary | ICD-10-CM | POA: Insufficient documentation

## 2015-06-12 HISTORY — DX: Spinal stenosis, cervical region: M48.02

## 2015-06-12 MED ORDER — SODIUM CHLORIDE 0.9 % IV SOLN
Freq: Once | INTRAVENOUS | Status: AC
  Administered 2015-06-12: 250 mL via INTRAVENOUS

## 2015-06-12 MED ORDER — ZOLEDRONIC ACID 5 MG/100ML IV SOLN
5.0000 mg | Freq: Once | INTRAVENOUS | Status: AC
  Administered 2015-06-12: 5 mg via INTRAVENOUS
  Filled 2015-06-12: qty 100

## 2015-06-12 NOTE — Discharge Instructions (Signed)
Drink  Fluids/water as tolerated over the next 72 hours °Tylenol or ibuprofen OTC as directed °Continue Calcium and Vit D as directed by your MD °RECLAST °Zoledronic Acid injection (Paget's Disease, Osteoporosis) °What is this medicine? °ZOLEDRONIC ACID (ZOE le dron ik AS id) lowers the amount of calcium loss from bone. It is used to treat Paget's disease and osteoporosis in women. °This medicine may be used for other purposes; ask your health care provider or pharmacist if you have questions. °COMMON BRAND NAME(S): Reclast, Zometa °What should I tell my health care provider before I take this medicine? °They need to know if you have any of these conditions: °-aspirin-sensitive asthma °-cancer, especially if you are receiving medicines used to treat cancer °-dental disease or wear dentures °-infection °-kidney disease °-low levels of calcium in the blood °-past surgery on the parathyroid gland or intestines °-receiving corticosteroids like dexamethasone or prednisone °-an unusual or allergic reaction to zoledronic acid, other medicines, foods, dyes, or preservatives °-pregnant or trying to get pregnant °-breast-feeding °How should I use this medicine? °This medicine is for infusion into a vein. It is given by a health care professional in a hospital or clinic setting. °Talk to your pediatrician regarding the use of this medicine in children. This medicine is not approved for use in children. °Overdosage: If you think you have taken too much of this medicine contact a poison control center or emergency room at once. °NOTE: This medicine is only for you. Do not share this medicine with others. °What if I miss a dose? °It is important not to miss your dose. Call your doctor or health care professional if you are unable to keep an appointment. °What may interact with this medicine? °-certain antibiotics given by injection °-NSAIDs, medicines for pain and inflammation, like ibuprofen or naproxen °-some diuretics like  bumetanide, furosemide °-teriparatide °This list may not describe all possible interactions. Give your health care provider a list of all the medicines, herbs, non-prescription drugs, or dietary supplements you use. Also tell them if you smoke, drink alcohol, or use illegal drugs. Some items may interact with your medicine. °What should I watch for while using this medicine? °Visit your doctor or health care professional for regular checkups. It may be some time before you see the benefit from this medicine. Do not stop taking your medicine unless your doctor tells you to. Your doctor may order blood tests or other tests to see how you are doing. °Women should inform their doctor if they wish to become pregnant or think they might be pregnant. There is a potential for serious side effects to an unborn child. Talk to your health care professional or pharmacist for more information. °You should make sure that you get enough calcium and vitamin D while you are taking this medicine. Discuss the foods you eat and the vitamins you take with your health care professional. °Some people who take this medicine have severe bone, joint, and/or muscle pain. This medicine may also increase your risk for jaw problems or a broken thigh bone. Tell your doctor right away if you have severe pain in your jaw, bones, joints, or muscles. Tell your doctor if you have any pain that does not go away or that gets worse. °Tell your dentist and dental surgeon that you are taking this medicine. You should not have major dental surgery while on this medicine. See your dentist to have a dental exam and fix any dental problems before starting this medicine. Take good care   of your teeth while on this medicine. Make sure you see your dentist for regular follow-up appointments. °What side effects may I notice from receiving this medicine? °Side effects that you should report to your doctor or health care professional as soon as possible: °-allergic  reactions like skin rash, itching or hives, swelling of the face, lips, or tongue °-anxiety, confusion, or depression °-breathing problems °-changes in vision °-eye pain °-feeling faint or lightheaded, falls °-jaw pain, especially after dental work °-mouth sores °-muscle cramps, stiffness, or weakness °-trouble passing urine or change in the amount of urine °Side effects that usually do not require medical attention (report to your doctor or health care professional if they continue or are bothersome): °-bone, joint, or muscle pain °-constipation °-diarrhea °-fever °-hair loss °-irritation at site where injected °-loss of appetite °-nausea, vomiting °-stomach upset °-trouble sleeping °-trouble swallowing °-weak or tired °This list may not describe all possible side effects. Call your doctor for medical advice about side effects. You may report side effects to FDA at 1-800-FDA-1088. °Where should I keep my medicine? °This drug is given in a hospital or clinic and will not be stored at home. °NOTE: This sheet is a summary. It may not cover all possible information. If you have questions about this medicine, talk to your doctor, pharmacist, or health care provider. °© 2015, [employer]vier/Gold Standard. (2013-02-27 10:03:48) °Osteoporosis °Throughout your life, your body breaks down old bone and replaces it with new bone. As you get older, your body does not replace bone as quickly as it breaks it down. By the age of 30 years, most people begin to gradually lose bone because of the imbalance between bone loss and replacement. Some people lose more bone than others. Bone loss beyond a specified normal degree is considered osteoporosis.  °Osteoporosis affects the strength and durability of your bones. The inside of the ends of your bones and your flat bones, like the bones of your pelvis, look like honeycomb, filled with tiny open spaces. As bone loss occurs, your bones become less dense. This means that the open spaces inside  your bones become bigger and the walls between these spaces become thinner. This makes your bones weaker. Bones of a person with osteoporosis can become so weak that they can break (fracture) during minor accidents, such as a simple fall. °CAUSES  °The following factors have been associated with the development of osteoporosis: °· Smoking. °· Drinking more than 2 alcoholic drinks several days per week. °· Long-term use of certain medicines: °¨ Corticosteroids. °¨ Chemotherapy medicines. °¨ Thyroid medicines. °¨ Antiepileptic medicines. °¨ Gonadal hormone suppression medicine. °¨ Immunosuppression medicine. °· Being underweight. °· Lack of physical activity. °· Lack of exposure to the sun. This can lead to vitamin D deficiency. °· Certain medical conditions: °¨ Certain inflammatory bowel diseases, such as Crohn disease and ulcerative colitis. °¨ Diabetes. °¨ Hyperthyroidism. °¨ Hyperparathyroidism. °RISK FACTORS °Anyone can develop osteoporosis. However, the following factors can increase your risk of developing osteoporosis: °· Gender--Women are at higher risk than men. °· Age--Being older than 50 years increases your risk. °· Ethnicity--White and Asian people have an increased risk. °· Weight --Being extremely underweight can increase your risk of osteoporosis. °· Family history of osteoporosis--Having a family member who has developed osteoporosis can increase your risk. °SYMPTOMS  °Usually, people with osteoporosis have no symptoms.  °DIAGNOSIS  °Signs during a physical exam that may prompt your caregiver to suspect osteoporosis include: °· Decreased height. This is usually caused by the   compression of the bones that form your spine (vertebrae) because they have weakened and become fractured. °· A curving or rounding of the upper back (kyphosis). °To confirm signs of osteoporosis, your caregiver may request a procedure that uses 2 low-dose X-ray beams with different levels of energy to measure your bone mineral  density (dual-energy X-ray absorptiometry [DXA]). Also, your caregiver may check your level of vitamin D. °TREATMENT  °The goal of osteoporosis treatment is to strengthen bones in order to decrease the risk of bone fractures. There are different types of medicines available to help achieve this goal. Some of these medicines work by slowing the processes of bone loss. Some medicines work by increasing bone density. Treatment also involves making sure that your levels of calcium and vitamin D are adequate. °PREVENTION  °There are things you can do to help prevent osteoporosis. Adequate intake of calcium and vitamin D can help you achieve optimal bone mineral density. Regular exercise can also help, especially resistance and weight-bearing activities. If you smoke, quitting smoking is an important part of osteoporosis prevention. °MAKE SURE YOU: °· Understand these instructions. °· Will watch your condition. °· Will get help right away if you are not doing well or get worse. °FOR MORE INFORMATION °www.osteo.org and www.nof.org °Document Released: 06/24/2005 Document Revised: 01/09/2013 Document Reviewed: 08/29/2011 °ExitCare® Patient Information ©2015 [employer]. This information is not intended to replace advice given to you by your health care provider. Make sure you discuss any questions you have with your health care provider. ° ° °

## 2015-07-08 DIAGNOSIS — M542 Cervicalgia: Secondary | ICD-10-CM | POA: Diagnosis not present

## 2015-07-08 DIAGNOSIS — M47812 Spondylosis without myelopathy or radiculopathy, cervical region: Secondary | ICD-10-CM | POA: Diagnosis not present

## 2015-07-08 DIAGNOSIS — G5603 Carpal tunnel syndrome, bilateral upper limbs: Secondary | ICD-10-CM | POA: Diagnosis not present

## 2015-07-08 DIAGNOSIS — M5412 Radiculopathy, cervical region: Secondary | ICD-10-CM | POA: Diagnosis not present

## 2015-07-10 DIAGNOSIS — R351 Nocturia: Secondary | ICD-10-CM | POA: Diagnosis not present

## 2015-07-10 DIAGNOSIS — R339 Retention of urine, unspecified: Secondary | ICD-10-CM | POA: Diagnosis not present

## 2015-07-16 DIAGNOSIS — N8182 Incompetence or weakening of pubocervical tissue: Secondary | ICD-10-CM | POA: Diagnosis not present

## 2015-08-09 DIAGNOSIS — Z0100 Encounter for examination of eyes and vision without abnormal findings: Secondary | ICD-10-CM | POA: Diagnosis not present

## 2015-08-09 DIAGNOSIS — Z0000 Encounter for general adult medical examination without abnormal findings: Secondary | ICD-10-CM | POA: Diagnosis not present

## 2015-08-09 DIAGNOSIS — M810 Age-related osteoporosis without current pathological fracture: Secondary | ICD-10-CM | POA: Diagnosis not present

## 2015-08-09 DIAGNOSIS — E785 Hyperlipidemia, unspecified: Secondary | ICD-10-CM | POA: Diagnosis not present

## 2015-08-09 DIAGNOSIS — H524 Presbyopia: Secondary | ICD-10-CM | POA: Diagnosis not present

## 2015-08-16 ENCOUNTER — Other Ambulatory Visit: Payer: Self-pay

## 2015-08-16 DIAGNOSIS — N3281 Overactive bladder: Secondary | ICD-10-CM | POA: Diagnosis not present

## 2015-08-16 DIAGNOSIS — E785 Hyperlipidemia, unspecified: Secondary | ICD-10-CM | POA: Diagnosis not present

## 2015-08-16 DIAGNOSIS — Z1389 Encounter for screening for other disorder: Secondary | ICD-10-CM | POA: Diagnosis not present

## 2015-08-16 DIAGNOSIS — Z1231 Encounter for screening mammogram for malignant neoplasm of breast: Secondary | ICD-10-CM

## 2015-08-16 DIAGNOSIS — N329 Bladder disorder, unspecified: Secondary | ICD-10-CM | POA: Diagnosis not present

## 2015-08-16 DIAGNOSIS — Z0000 Encounter for general adult medical examination without abnormal findings: Secondary | ICD-10-CM | POA: Diagnosis not present

## 2015-08-16 DIAGNOSIS — G5601 Carpal tunnel syndrome, right upper limb: Secondary | ICD-10-CM | POA: Diagnosis not present

## 2015-08-16 DIAGNOSIS — K589 Irritable bowel syndrome without diarrhea: Secondary | ICD-10-CM | POA: Diagnosis not present

## 2015-08-16 DIAGNOSIS — M810 Age-related osteoporosis without current pathological fracture: Secondary | ICD-10-CM | POA: Diagnosis not present

## 2015-08-16 DIAGNOSIS — R351 Nocturia: Secondary | ICD-10-CM | POA: Diagnosis not present

## 2015-08-16 DIAGNOSIS — Z23 Encounter for immunization: Secondary | ICD-10-CM | POA: Diagnosis not present

## 2015-08-28 DIAGNOSIS — Z23 Encounter for immunization: Secondary | ICD-10-CM | POA: Diagnosis not present

## 2015-08-30 DIAGNOSIS — M79651 Pain in right thigh: Secondary | ICD-10-CM | POA: Diagnosis not present

## 2015-08-30 DIAGNOSIS — S76011D Strain of muscle, fascia and tendon of right hip, subsequent encounter: Secondary | ICD-10-CM | POA: Diagnosis not present

## 2015-08-30 DIAGNOSIS — M7071 Other bursitis of hip, right hip: Secondary | ICD-10-CM | POA: Diagnosis not present

## 2015-09-11 DIAGNOSIS — N390 Urinary tract infection, site not specified: Secondary | ICD-10-CM | POA: Diagnosis not present

## 2015-09-11 DIAGNOSIS — R300 Dysuria: Secondary | ICD-10-CM | POA: Diagnosis not present

## 2015-09-13 DIAGNOSIS — N390 Urinary tract infection, site not specified: Secondary | ICD-10-CM | POA: Diagnosis not present

## 2015-09-13 DIAGNOSIS — R351 Nocturia: Secondary | ICD-10-CM | POA: Diagnosis not present

## 2015-09-13 DIAGNOSIS — R350 Frequency of micturition: Secondary | ICD-10-CM | POA: Diagnosis not present

## 2015-09-25 ENCOUNTER — Ambulatory Visit
Admission: RE | Admit: 2015-09-25 | Discharge: 2015-09-25 | Disposition: A | Discharge status: Home / Self Care | Payer: Medicare HMO | Source: Ambulatory Visit

## 2015-09-25 DIAGNOSIS — Z1231 Encounter for screening mammogram for malignant neoplasm of breast: Secondary | ICD-10-CM

## 2015-10-13 DIAGNOSIS — L309 Dermatitis, unspecified: Secondary | ICD-10-CM | POA: Diagnosis not present

## 2015-10-14 DIAGNOSIS — Z6826 Body mass index (BMI) 26.0-26.9, adult: Secondary | ICD-10-CM | POA: Diagnosis not present

## 2015-10-14 DIAGNOSIS — B0229 Other postherpetic nervous system involvement: Secondary | ICD-10-CM | POA: Diagnosis not present

## 2015-11-06 DIAGNOSIS — L570 Actinic keratosis: Secondary | ICD-10-CM | POA: Diagnosis not present

## 2015-11-06 DIAGNOSIS — D485 Neoplasm of uncertain behavior of skin: Secondary | ICD-10-CM | POA: Diagnosis not present

## 2015-11-06 DIAGNOSIS — L820 Inflamed seborrheic keratosis: Secondary | ICD-10-CM | POA: Diagnosis not present

## 2015-11-15 DIAGNOSIS — Z87440 Personal history of urinary (tract) infections: Secondary | ICD-10-CM | POA: Diagnosis not present

## 2015-11-15 DIAGNOSIS — R351 Nocturia: Secondary | ICD-10-CM | POA: Diagnosis not present

## 2015-11-15 DIAGNOSIS — Z0000 Encounter for general adult medical examination without abnormal findings: Secondary | ICD-10-CM | POA: Diagnosis not present

## 2015-12-05 DIAGNOSIS — M546 Pain in thoracic spine: Secondary | ICD-10-CM | POA: Diagnosis not present

## 2015-12-11 DIAGNOSIS — R69 Illness, unspecified: Secondary | ICD-10-CM | POA: Diagnosis not present

## 2015-12-18 DIAGNOSIS — N390 Urinary tract infection, site not specified: Secondary | ICD-10-CM | POA: Diagnosis not present

## 2016-01-13 DIAGNOSIS — Z6826 Body mass index (BMI) 26.0-26.9, adult: Secondary | ICD-10-CM | POA: Diagnosis not present

## 2016-01-13 DIAGNOSIS — G4700 Insomnia, unspecified: Secondary | ICD-10-CM | POA: Diagnosis not present

## 2016-01-13 DIAGNOSIS — J09X3 Influenza due to identified novel influenza A virus with gastrointestinal manifestations: Secondary | ICD-10-CM | POA: Diagnosis not present

## 2016-01-13 DIAGNOSIS — M546 Pain in thoracic spine: Secondary | ICD-10-CM | POA: Diagnosis not present

## 2016-01-13 DIAGNOSIS — R112 Nausea with vomiting, unspecified: Secondary | ICD-10-CM | POA: Diagnosis not present

## 2016-01-13 DIAGNOSIS — R509 Fever, unspecified: Secondary | ICD-10-CM | POA: Diagnosis not present

## 2016-01-13 DIAGNOSIS — N390 Urinary tract infection, site not specified: Secondary | ICD-10-CM | POA: Diagnosis not present

## 2016-01-23 DIAGNOSIS — R8299 Other abnormal findings in urine: Secondary | ICD-10-CM | POA: Diagnosis not present

## 2016-01-23 DIAGNOSIS — N390 Urinary tract infection, site not specified: Secondary | ICD-10-CM | POA: Diagnosis not present

## 2016-02-25 DIAGNOSIS — M19049 Primary osteoarthritis, unspecified hand: Secondary | ICD-10-CM | POA: Insufficient documentation

## 2016-02-25 DIAGNOSIS — M1812 Unilateral primary osteoarthritis of first carpometacarpal joint, left hand: Secondary | ICD-10-CM | POA: Diagnosis not present

## 2016-02-25 DIAGNOSIS — M1811 Unilateral primary osteoarthritis of first carpometacarpal joint, right hand: Secondary | ICD-10-CM | POA: Diagnosis not present

## 2016-02-26 DIAGNOSIS — M1811 Unilateral primary osteoarthritis of first carpometacarpal joint, right hand: Secondary | ICD-10-CM | POA: Diagnosis not present

## 2016-02-26 DIAGNOSIS — M79645 Pain in left finger(s): Secondary | ICD-10-CM | POA: Diagnosis not present

## 2016-02-26 DIAGNOSIS — M79644 Pain in right finger(s): Secondary | ICD-10-CM | POA: Diagnosis not present

## 2016-02-26 DIAGNOSIS — M1812 Unilateral primary osteoarthritis of first carpometacarpal joint, left hand: Secondary | ICD-10-CM | POA: Diagnosis not present

## 2016-02-29 DIAGNOSIS — M79609 Pain in unspecified limb: Secondary | ICD-10-CM | POA: Insufficient documentation

## 2016-03-11 DIAGNOSIS — R140 Abdominal distension (gaseous): Secondary | ICD-10-CM | POA: Diagnosis not present

## 2016-03-11 DIAGNOSIS — R197 Diarrhea, unspecified: Secondary | ICD-10-CM | POA: Diagnosis not present

## 2016-03-11 DIAGNOSIS — H8110 Benign paroxysmal vertigo, unspecified ear: Secondary | ICD-10-CM | POA: Diagnosis not present

## 2016-04-07 DIAGNOSIS — M79644 Pain in right finger(s): Secondary | ICD-10-CM | POA: Diagnosis not present

## 2016-04-07 DIAGNOSIS — M1811 Unilateral primary osteoarthritis of first carpometacarpal joint, right hand: Secondary | ICD-10-CM | POA: Diagnosis not present

## 2016-04-07 DIAGNOSIS — G5602 Carpal tunnel syndrome, left upper limb: Secondary | ICD-10-CM | POA: Diagnosis not present

## 2016-04-23 DIAGNOSIS — S76011D Strain of muscle, fascia and tendon of right hip, subsequent encounter: Secondary | ICD-10-CM | POA: Diagnosis not present

## 2016-04-23 DIAGNOSIS — M7061 Trochanteric bursitis, right hip: Secondary | ICD-10-CM | POA: Diagnosis not present

## 2016-04-28 HISTORY — PX: FINGER ARTHRODESIS: SHX5000

## 2016-05-13 DIAGNOSIS — M19041 Primary osteoarthritis, right hand: Secondary | ICD-10-CM | POA: Diagnosis not present

## 2016-05-18 DIAGNOSIS — M19041 Primary osteoarthritis, right hand: Secondary | ICD-10-CM | POA: Diagnosis not present

## 2016-05-18 DIAGNOSIS — M79644 Pain in right finger(s): Secondary | ICD-10-CM | POA: Diagnosis not present

## 2016-05-19 DIAGNOSIS — Z6825 Body mass index (BMI) 25.0-25.9, adult: Secondary | ICD-10-CM | POA: Diagnosis not present

## 2016-05-19 DIAGNOSIS — M810 Age-related osteoporosis without current pathological fracture: Secondary | ICD-10-CM | POA: Diagnosis not present

## 2016-05-20 DIAGNOSIS — D2271 Melanocytic nevi of right lower limb, including hip: Secondary | ICD-10-CM | POA: Diagnosis not present

## 2016-05-20 DIAGNOSIS — D692 Other nonthrombocytopenic purpura: Secondary | ICD-10-CM | POA: Diagnosis not present

## 2016-05-20 DIAGNOSIS — L723 Sebaceous cyst: Secondary | ICD-10-CM | POA: Diagnosis not present

## 2016-05-20 DIAGNOSIS — D2272 Melanocytic nevi of left lower limb, including hip: Secondary | ICD-10-CM | POA: Diagnosis not present

## 2016-05-20 DIAGNOSIS — L821 Other seborrheic keratosis: Secondary | ICD-10-CM | POA: Diagnosis not present

## 2016-05-20 DIAGNOSIS — D225 Melanocytic nevi of trunk: Secondary | ICD-10-CM | POA: Diagnosis not present

## 2016-05-20 DIAGNOSIS — D1801 Hemangioma of skin and subcutaneous tissue: Secondary | ICD-10-CM | POA: Diagnosis not present

## 2016-05-20 DIAGNOSIS — L814 Other melanin hyperpigmentation: Secondary | ICD-10-CM | POA: Diagnosis not present

## 2016-05-20 DIAGNOSIS — L720 Epidermal cyst: Secondary | ICD-10-CM | POA: Diagnosis not present

## 2016-05-20 DIAGNOSIS — D224 Melanocytic nevi of scalp and neck: Secondary | ICD-10-CM | POA: Diagnosis not present

## 2016-05-29 DIAGNOSIS — M79671 Pain in right foot: Secondary | ICD-10-CM | POA: Diagnosis not present

## 2016-05-29 DIAGNOSIS — Z23 Encounter for immunization: Secondary | ICD-10-CM | POA: Diagnosis not present

## 2016-06-09 DIAGNOSIS — M1811 Unilateral primary osteoarthritis of first carpometacarpal joint, right hand: Secondary | ICD-10-CM | POA: Diagnosis not present

## 2016-06-09 DIAGNOSIS — M79644 Pain in right finger(s): Secondary | ICD-10-CM | POA: Diagnosis not present

## 2016-06-11 DIAGNOSIS — M79671 Pain in right foot: Secondary | ICD-10-CM | POA: Diagnosis not present

## 2016-06-17 DIAGNOSIS — R69 Illness, unspecified: Secondary | ICD-10-CM | POA: Diagnosis not present

## 2016-07-07 DIAGNOSIS — Z96691 Finger-joint replacement of right hand: Secondary | ICD-10-CM | POA: Diagnosis not present

## 2016-07-07 DIAGNOSIS — M7061 Trochanteric bursitis, right hip: Secondary | ICD-10-CM | POA: Diagnosis not present

## 2016-07-07 DIAGNOSIS — Z981 Arthrodesis status: Secondary | ICD-10-CM | POA: Diagnosis not present

## 2016-07-07 DIAGNOSIS — Z9889 Other specified postprocedural states: Secondary | ICD-10-CM | POA: Diagnosis not present

## 2016-07-30 ENCOUNTER — Ambulatory Visit (INDEPENDENT_AMBULATORY_CARE_PROVIDER_SITE_OTHER): Payer: Medicare HMO | Admitting: Podiatry

## 2016-07-30 ENCOUNTER — Encounter: Payer: Self-pay | Admitting: Podiatry

## 2016-07-30 VITALS — BP 92/46 | HR 75 | Resp 16 | Ht 63.0 in | Wt 140.0 lb

## 2016-07-30 DIAGNOSIS — L600 Ingrowing nail: Secondary | ICD-10-CM | POA: Diagnosis not present

## 2016-07-30 MED ORDER — NEOMYCIN-POLYMYXIN-HC 3.5-10000-1 OT SOLN: OTIC | 0 refills | Status: DC

## 2016-07-30 NOTE — Progress Notes (Signed)
   Subjective:    Patient ID: Alicia Bradley, female    DOB: 1941-02-07, 75 y.o.   MRN: [id number]  HPI: She presents today with a 2 week duration of pain to the tibial border of the hallux nail plate left. She states that it bothers him enough that I would like to work on himself and remove the edge. She states that all things infected but is exquisitely sore.    Review of Systems  Gastrointestinal: Positive for abdominal distention.  Genitourinary: Positive for frequency.  All other systems reviewed and are negative.      Objective:   Physical Exam: Vital signs are stable alert and oriented 3. Pulses are palpable. Neurologic sensorium is intact. Degenerative flexor Tach. Muscle strength bilateral symmetrical full.  Cutaneous evaluation demonstrates a well-hydrated cutis Sharp incurvated toenail along the tibial border of the hallux left with erythema. No purulence no malodor.  Orthopedic evaluation and shows all joints distal to the ankle range of motion without crepitation. No major abnormalities.        Assessment & Plan:  Assessment: Ingrown toenail paronychia tibial border hallux left.  Plan: Chemical matrixectomy was performed today after local anesthesia was administered. Tolerated procedure well without complications. I will follow-up with him in 1 week. He was provided with both oral and written home going instructions for care and soaking of his toe.

## 2016-07-30 NOTE — Patient Instructions (Signed)

## 2016-08-12 DIAGNOSIS — H524 Presbyopia: Secondary | ICD-10-CM | POA: Diagnosis not present

## 2016-08-12 DIAGNOSIS — H40013 Open angle with borderline findings, low risk, bilateral: Secondary | ICD-10-CM | POA: Diagnosis not present

## 2016-08-12 DIAGNOSIS — H52223 Regular astigmatism, bilateral: Secondary | ICD-10-CM | POA: Diagnosis not present

## 2016-08-12 DIAGNOSIS — H43813 Vitreous degeneration, bilateral: Secondary | ICD-10-CM | POA: Diagnosis not present

## 2016-08-12 DIAGNOSIS — Z961 Presence of intraocular lens: Secondary | ICD-10-CM | POA: Diagnosis not present

## 2016-08-16 ENCOUNTER — Ambulatory Visit (HOSPITAL_COMMUNITY)
Admission: EM | Admit: 2016-08-16 | Discharge: 2016-08-16 | Disposition: A | Discharge status: Home / Self Care | Payer: Medicare HMO

## 2016-08-16 ENCOUNTER — Encounter (HOSPITAL_COMMUNITY): Payer: Self-pay | Admitting: Emergency Medicine

## 2016-08-16 DIAGNOSIS — T23142A Burn of first degree of multiple left fingers (nail), including thumb, initial encounter: Secondary | ICD-10-CM | POA: Diagnosis not present

## 2016-08-16 NOTE — ED Triage Notes (Signed)
Patient presents to [facility] today, she was cooking this morning and burnt two of her finger, ring and pinky on the stove. Patient states they feels that they will blister.

## 2016-08-16 NOTE — ED Provider Notes (Signed)
CSN: [id number]     Arrival date & time 08/16/16  1408 History   None    Chief Complaint  Patient presents with  . Hand Burn   (Consider location/radiation/quality/duration/timing/severity/associated sxs/prior Treatment)  HPI  Patient is a 75 year old female presenting this afternoon with complaints of a burn to her hand approximately 2 hours ago while baking. In addition she has a complaint of a reddened rash on her right thumb and anterior surface of her hand this been present for several weeks. Patient denies significant discomfort and management. States she is concerned they "might blister".    Past Medical History:  Diagnosis Date  . 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 Cervical stenosis of spine   . Chronic kidney disease    nocturia   . Irritable bowel syndrome   . Osteoporosis    Past Surgical History:  Procedure Laterality Date  . CARPAL TUNNEL RELEASE     right wrist   . CESAREAN SECTION     x 2  . EXCISION/RELEASE BURSA HIP  11/25/2011   Procedure: EXCISION/RELEASE BURSA HIP;  Surgeon: Gearlean Alf, MD;  Location: [facility];  Service: Orthopedics;  Laterality: Right;  Right Hip Bursectomy with Possible Tendon Repair  . EYE SURGERY     bilateral cataract surgery with lens   . FRACTURE SURGERY     fifth metacarpal   . miscarriage     . MRCP      fall / 2012 [facility]   . OTHER SURGICAL HISTORY     transvaginal sling and cystocele   . OTHER SURGICAL HISTORY     vaginal correction and cystocele   . OTHER SURGICAL HISTORY     root canals x 3   . OTHER SURGICAL HISTORY     cubital tunnel on right elbow    No family history on file. Social History  Substance Use Topics  . Smoking status: Never Smoker  . Smokeless tobacco: Never Used  . Alcohol use 1.2 oz/week    2 Glasses of wine per week   OB History    No data available     Review of Systems  Constitutional: Negative.  Negative for fatigue and fever.  HENT: Negative.   Eyes: Negative.   Negative for visual disturbance.  Respiratory: Negative.  Negative for cough and shortness of breath.   Cardiovascular: Negative.  Negative for chest pain and leg swelling.  Gastrointestinal: Negative.   Endocrine: Negative.   Genitourinary: Negative.   Musculoskeletal: Negative.  Negative for gait problem and neck stiffness.  Skin: Positive for wound.  Allergic/Immunologic: Negative.   Neurological: Negative.  Negative for dizziness and headaches.  Hematological: Negative.   Psychiatric/Behavioral: Negative.     Allergies  Latex and Robaxin [methocarbamol]  Home Medications   Prior to Admission medications   Medication Sig Start Date End Date Taking? Authorizing Provider  aspirin EC 81 MG tablet Take 81 mg by mouth every other day.   Yes Historical Provider, MD  citalopram (CELEXA) 20 MG tablet Take 20 mg by mouth at bedtime.   Yes Historical Provider, MD  desmopressin (DDAVP) 0.2 MG tablet Take 0.2 mg by mouth 2 (two) times daily.    Yes Historical Provider, MD  omeprazole (PRILOSEC) 20 MG capsule Take 20 mg by mouth as needed.    Yes Historical Provider, MD  Probiotic Product (ALIGN) 4 MG CAPS Take 4 mg by mouth daily.   Yes Historical Provider, MD  simvastatin (ZOCOR) 20  MG tablet Take 20 mg by mouth every evening.   Yes Historical Provider, MD  VITAMIN D, CHOLECALCIFEROL, PO Take 1 tablet by mouth daily.   Yes Historical Provider, MD  Alpha-D-Galactosidase (BEANO PO) Take 1 tablet by mouth 3 (three) times daily as needed. gas    Historical Provider, MD  calcium carbonate (TUMS - DOSED IN MG ELEMENTAL CALCIUM) 500 MG chewable tablet Chew 1 tablet by mouth 3 (three) times daily.    Historical Provider, MD  celecoxib (CELEBREX) 200 MG capsule Take 200 mg by mouth daily as needed. Pain    Historical Provider, MD  cetirizine (ZYRTEC) 10 MG tablet Take 10 mg by mouth daily as needed. Allergies     Historical Provider, MD  DIGESTIVE ENZYMES PO Take 1 capsule by mouth daily.     Historical Provider, MD  estradiol-norethindrone (ACTIVELLA) 1-0.5 MG per tablet Take 1 tablet by mouth daily.    Historical Provider, MD  lipase/protease/amylase (CREON-10/PANCREASE) 12000 UNITS CPEP Take 1 capsule by mouth daily.    Historical Provider, MD  meloxicam (MOBIC) 15 MG tablet Take 15 mg by mouth daily.    Historical Provider, MD  Multiple Vitamin (MULITIVITAMIN WITH MINERALS) TABS Take 1 tablet by mouth daily.    Historical Provider, MD  naproxen sodium (ANAPROX) 220 MG tablet Take 440 mg by mouth 2 (two) times daily as needed. Pain    Historical Provider, MD  neomycin-polymyxin-hydrocortisone (CORTISPORIN) otic solution Apply one to two drops to toe after soaking twice daily. 07/30/16   Max T Hyatt, DPM  OVER THE COUNTER MEDICATION Take 1 tablet by mouth daily. Suprema     Historical Provider, MD  Simethicone (PHAZYME PO) Take 1 tablet by mouth 3 (three) times daily as needed. Gas    Historical Provider, MD   Meds Ordered and Administered this Visit  Medications - No data to display  BP 119/59 (BP Location: Left Arm)   Pulse 71   Temp 97.9 F (36.6 C) (Oral)   Resp 16   SpO2 99%  No data found.   Physical Exam  Constitutional: She appears well-developed and well-nourished. No distress.  HENT:  Head: Normocephalic and atraumatic.  Eyes: Conjunctivae are normal.  Neck: Neck supple.  Cardiovascular: Normal rate and regular rhythm.   No murmur heard. Pulmonary/Chest: Effort normal and breath sounds normal. No respiratory distress.  Abdominal: Soft. There is no tenderness.  Musculoskeletal: She exhibits no edema.  Neurological: She is alert.  Skin: Skin is warm and dry. Capillary refill takes less than 2 seconds. Burn and rash noted. Rash is not macular, not papular and not pustular. No cyanosis. Nails show no clubbing.     Psychiatric: She has a normal mood and affect.  Nursing note and vitals reviewed.   Urgent Care Course   Clinical Course     Procedures  (including critical care time)  Labs Review Labs Reviewed - No data to display  Imaging Review No results found.  Burn is not extensive enough to warrant silvadene.  Advised to keep clean and covered.  Bacitracin if blisters break.  Try hydrocortisone OTC on rash for 3 days and no improvement follow up with her dermatologist.   MDM   1. Superficial burn of multiple digits of left hand including superficial burn of thumb, initial encounter    The usual and customary discharge instructions and warnings were given.  The patient verbalizes understanding and agrees to plan of care.       Nehemiah Settle, NP  08/16/16 1704  

## 2016-08-17 DIAGNOSIS — R8290 Unspecified abnormal findings in urine: Secondary | ICD-10-CM | POA: Diagnosis not present

## 2016-08-17 DIAGNOSIS — E784 Other hyperlipidemia: Secondary | ICD-10-CM | POA: Diagnosis not present

## 2016-08-17 DIAGNOSIS — M810 Age-related osteoporosis without current pathological fracture: Secondary | ICD-10-CM | POA: Diagnosis not present

## 2016-08-17 DIAGNOSIS — R358 Other polyuria: Secondary | ICD-10-CM | POA: Diagnosis not present

## 2016-08-18 ENCOUNTER — Ambulatory Visit: Payer: Medicare HMO | Admitting: Podiatry

## 2016-08-24 DIAGNOSIS — Z0000 Encounter for general adult medical examination without abnormal findings: Secondary | ICD-10-CM | POA: Diagnosis not present

## 2016-08-25 ENCOUNTER — Other Ambulatory Visit: Payer: Self-pay | Admitting: Internal Medicine

## 2016-08-25 ENCOUNTER — Encounter: Payer: Self-pay | Admitting: Podiatry

## 2016-08-25 ENCOUNTER — Ambulatory Visit (INDEPENDENT_AMBULATORY_CARE_PROVIDER_SITE_OTHER): Payer: Medicare HMO | Admitting: Podiatry

## 2016-08-25 DIAGNOSIS — Z1231 Encounter for screening mammogram for malignant neoplasm of breast: Secondary | ICD-10-CM

## 2016-08-25 DIAGNOSIS — L600 Ingrowing nail: Secondary | ICD-10-CM

## 2016-08-25 NOTE — Patient Instructions (Signed)

## 2016-08-26 NOTE — Progress Notes (Signed)
She presents today for follow-up of her matrixectomy hallux left. She states that is doing great almost canceled my appointment. She states that she is no longer soaking.  Objective: Vital signs are stable she is alert and oriented 3 mild edema and erythema proximal nail fold medially. No purulence or malodor I do notice some serosanguineous type drainage.  Assessment: Slowly healing matrixectomy hallux left.  Plan: I encouraged her to soak in Epsom salts and warm water she will follow up with me as needed. I instructed her to call us if this is not healed in 1 week after soaking in Epsom salts and warm water and apply Cortisporin otic. She will cover during the daytime with a Band-Aid and leave open at nighttime.

## 2016-09-25 ENCOUNTER — Ambulatory Visit
Admission: RE | Admit: 2016-09-25 | Discharge: 2016-09-25 | Disposition: A | Discharge status: Home / Self Care | Payer: Medicare HMO | Source: Ambulatory Visit | Attending: Internal Medicine | Admitting: Internal Medicine

## 2016-09-25 DIAGNOSIS — Z1231 Encounter for screening mammogram for malignant neoplasm of breast: Secondary | ICD-10-CM | POA: Diagnosis not present

## 2016-11-27 DIAGNOSIS — M7061 Trochanteric bursitis, right hip: Secondary | ICD-10-CM | POA: Diagnosis not present

## 2016-11-30 DIAGNOSIS — R351 Nocturia: Secondary | ICD-10-CM | POA: Diagnosis not present

## 2016-11-30 DIAGNOSIS — R3914 Feeling of incomplete bladder emptying: Secondary | ICD-10-CM | POA: Diagnosis not present

## 2016-11-30 DIAGNOSIS — R350 Frequency of micturition: Secondary | ICD-10-CM | POA: Diagnosis not present

## 2016-12-05 DIAGNOSIS — M7061 Trochanteric bursitis, right hip: Secondary | ICD-10-CM | POA: Diagnosis not present

## 2016-12-07 DIAGNOSIS — N760 Acute vaginitis: Secondary | ICD-10-CM | POA: Diagnosis not present

## 2016-12-14 DIAGNOSIS — Z6826 Body mass index (BMI) 26.0-26.9, adult: Secondary | ICD-10-CM | POA: Diagnosis not present

## 2016-12-14 DIAGNOSIS — R509 Fever, unspecified: Secondary | ICD-10-CM | POA: Diagnosis not present

## 2016-12-14 DIAGNOSIS — N390 Urinary tract infection, site not specified: Secondary | ICD-10-CM | POA: Diagnosis not present

## 2016-12-18 DIAGNOSIS — M7061 Trochanteric bursitis, right hip: Secondary | ICD-10-CM | POA: Diagnosis not present

## 2016-12-30 DIAGNOSIS — M2041 Other hammer toe(s) (acquired), right foot: Secondary | ICD-10-CM | POA: Diagnosis not present

## 2016-12-30 DIAGNOSIS — R69 Illness, unspecified: Secondary | ICD-10-CM | POA: Diagnosis not present

## 2016-12-30 DIAGNOSIS — L853 Xerosis cutis: Secondary | ICD-10-CM | POA: Diagnosis not present

## 2017-01-12 DIAGNOSIS — N390 Urinary tract infection, site not specified: Secondary | ICD-10-CM | POA: Diagnosis not present

## 2017-01-12 DIAGNOSIS — R8299 Other abnormal findings in urine: Secondary | ICD-10-CM | POA: Diagnosis not present

## 2017-01-12 DIAGNOSIS — R300 Dysuria: Secondary | ICD-10-CM | POA: Diagnosis not present

## 2017-01-27 DIAGNOSIS — N390 Urinary tract infection, site not specified: Secondary | ICD-10-CM | POA: Diagnosis not present

## 2017-01-27 DIAGNOSIS — R300 Dysuria: Secondary | ICD-10-CM | POA: Diagnosis not present

## 2017-02-09 DIAGNOSIS — M7071 Other bursitis of hip, right hip: Secondary | ICD-10-CM | POA: Diagnosis not present

## 2017-02-09 DIAGNOSIS — M25551 Pain in right hip: Secondary | ICD-10-CM | POA: Diagnosis not present

## 2017-02-19 DIAGNOSIS — N390 Urinary tract infection, site not specified: Secondary | ICD-10-CM | POA: Diagnosis not present

## 2017-02-19 DIAGNOSIS — R300 Dysuria: Secondary | ICD-10-CM | POA: Diagnosis not present

## 2017-02-24 DIAGNOSIS — N3020 Other chronic cystitis without hematuria: Secondary | ICD-10-CM | POA: Diagnosis not present

## 2017-03-19 DIAGNOSIS — N952 Postmenopausal atrophic vaginitis: Secondary | ICD-10-CM | POA: Diagnosis not present

## 2017-03-19 DIAGNOSIS — N3000 Acute cystitis without hematuria: Secondary | ICD-10-CM | POA: Diagnosis not present

## 2017-03-19 DIAGNOSIS — R351 Nocturia: Secondary | ICD-10-CM | POA: Diagnosis not present

## 2017-04-14 DIAGNOSIS — Z6825 Body mass index (BMI) 25.0-25.9, adult: Secondary | ICD-10-CM | POA: Diagnosis not present

## 2017-04-14 DIAGNOSIS — R05 Cough: Secondary | ICD-10-CM | POA: Diagnosis not present

## 2017-04-14 DIAGNOSIS — J189 Pneumonia, unspecified organism: Secondary | ICD-10-CM | POA: Diagnosis not present

## 2017-05-20 DIAGNOSIS — Z6826 Body mass index (BMI) 26.0-26.9, adult: Secondary | ICD-10-CM | POA: Diagnosis not present

## 2017-05-20 DIAGNOSIS — H6121 Impacted cerumen, right ear: Secondary | ICD-10-CM | POA: Diagnosis not present

## 2017-05-20 DIAGNOSIS — R0600 Dyspnea, unspecified: Secondary | ICD-10-CM | POA: Diagnosis not present

## 2017-05-20 DIAGNOSIS — R05 Cough: Secondary | ICD-10-CM | POA: Diagnosis not present

## 2017-05-28 DIAGNOSIS — H9191 Unspecified hearing loss, right ear: Secondary | ICD-10-CM | POA: Diagnosis not present

## 2017-05-28 DIAGNOSIS — H6123 Impacted cerumen, bilateral: Secondary | ICD-10-CM | POA: Diagnosis not present

## 2017-05-28 DIAGNOSIS — Z6825 Body mass index (BMI) 25.0-25.9, adult: Secondary | ICD-10-CM | POA: Diagnosis not present

## 2017-06-09 DIAGNOSIS — R351 Nocturia: Secondary | ICD-10-CM | POA: Diagnosis not present

## 2017-06-09 DIAGNOSIS — N3000 Acute cystitis without hematuria: Secondary | ICD-10-CM | POA: Diagnosis not present

## 2017-06-09 DIAGNOSIS — N3281 Overactive bladder: Secondary | ICD-10-CM | POA: Diagnosis not present

## 2017-07-07 DIAGNOSIS — R69 Illness, unspecified: Secondary | ICD-10-CM | POA: Diagnosis not present

## 2017-07-07 DIAGNOSIS — L853 Xerosis cutis: Secondary | ICD-10-CM | POA: Diagnosis not present

## 2017-07-07 DIAGNOSIS — M2041 Other hammer toe(s) (acquired), right foot: Secondary | ICD-10-CM | POA: Diagnosis not present

## 2017-07-23 DIAGNOSIS — C44329 Squamous cell carcinoma of skin of other parts of face: Secondary | ICD-10-CM | POA: Diagnosis not present

## 2017-07-23 DIAGNOSIS — C44321 Squamous cell carcinoma of skin of nose: Secondary | ICD-10-CM | POA: Diagnosis not present

## 2017-07-23 DIAGNOSIS — D1801 Hemangioma of skin and subcutaneous tissue: Secondary | ICD-10-CM | POA: Diagnosis not present

## 2017-07-23 DIAGNOSIS — L918 Other hypertrophic disorders of the skin: Secondary | ICD-10-CM | POA: Diagnosis not present

## 2017-07-23 DIAGNOSIS — D485 Neoplasm of uncertain behavior of skin: Secondary | ICD-10-CM | POA: Diagnosis not present

## 2017-07-23 DIAGNOSIS — L821 Other seborrheic keratosis: Secondary | ICD-10-CM | POA: Diagnosis not present

## 2017-07-23 DIAGNOSIS — L570 Actinic keratosis: Secondary | ICD-10-CM | POA: Diagnosis not present

## 2017-07-23 DIAGNOSIS — L814 Other melanin hyperpigmentation: Secondary | ICD-10-CM | POA: Diagnosis not present

## 2017-07-23 DIAGNOSIS — L820 Inflamed seborrheic keratosis: Secondary | ICD-10-CM | POA: Diagnosis not present

## 2017-07-23 DIAGNOSIS — L818 Other specified disorders of pigmentation: Secondary | ICD-10-CM | POA: Diagnosis not present

## 2017-07-23 DIAGNOSIS — L720 Epidermal cyst: Secondary | ICD-10-CM | POA: Diagnosis not present

## 2017-07-23 DIAGNOSIS — D2271 Melanocytic nevi of right lower limb, including hip: Secondary | ICD-10-CM | POA: Diagnosis not present

## 2017-08-04 ENCOUNTER — Ambulatory Visit: Payer: Medicare HMO | Admitting: Podiatry

## 2017-08-04 ENCOUNTER — Encounter: Payer: Self-pay | Admitting: Podiatry

## 2017-08-04 DIAGNOSIS — L600 Ingrowing nail: Secondary | ICD-10-CM | POA: Diagnosis not present

## 2017-08-08 NOTE — Progress Notes (Signed)
   Subjective: Patient presents today for evaluation of pain to the medial border of the left great toe. Patient is concerned for possible ingrown nail. Patient states that the pain has been present for a few weeks now. Patient presents today for further treatment and evaluation.  Past Medical History:  Diagnosis Date  . 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 Cervical stenosis of spine   . Chronic kidney disease    nocturia   . Irritable bowel syndrome   . Osteoporosis     Objective:  General: Well developed, nourished, in no acute distress, alert and oriented x3   Dermatology: Skin is warm, dry and supple bilateral. Medial border of the left great toe appears to be erythematous with evidence of an ingrowing nail. Pain on palpation noted to the border of the nail fold. The remaining nails appear unremarkable at this time. There are no open sores, lesions.  Vascular: Dorsalis Pedis artery and Posterior Tibial artery pedal pulses palpable. No lower extremity edema noted.   Neruologic: Grossly intact via light touch bilateral.  Musculoskeletal: Muscular strength within normal limits in all groups bilateral. Normal range of motion noted to all pedal and ankle joints.   Assesement: #1 Paronychia with ingrowing nail medial border of the left great toe #2 Pain in toe #3 Incurvated nail  Plan of Care:  1. Patient evaluated.  2. Discussed treatment alternatives and plan of care. Explained nail avulsion procedure and post procedure course to patient. 3. Patient opted for permanent partial nail avulsion.  4. Prior to procedure, local anesthesia infiltration utilized using 3 ml of a 50:50 mixture of 2% plain lidocaine and 0.5% plain marcaine in a normal hallux block fashion and a betadine prep performed.  5. Partial permanent nail avulsion with chemical matrixectomy performed using [id number] applications of phenol followed by alcohol flush.  6. Light dressing applied. 7. Return to clinic  in 2 weeks.   Edrick Kins, DPM [facility]  Dr. Edrick Kins, [address]                                        [place], [place] [address]                Office [phone]  Fax [phone]

## 2017-08-12 DIAGNOSIS — E7849 Other hyperlipidemia: Secondary | ICD-10-CM | POA: Diagnosis not present

## 2017-08-12 DIAGNOSIS — Z0000 Encounter for general adult medical examination without abnormal findings: Secondary | ICD-10-CM | POA: Diagnosis not present

## 2017-08-12 DIAGNOSIS — M810 Age-related osteoporosis without current pathological fracture: Secondary | ICD-10-CM | POA: Diagnosis not present

## 2017-08-24 DIAGNOSIS — Z1212 Encounter for screening for malignant neoplasm of rectum: Secondary | ICD-10-CM | POA: Diagnosis not present

## 2017-08-26 DIAGNOSIS — M810 Age-related osteoporosis without current pathological fracture: Secondary | ICD-10-CM | POA: Diagnosis not present

## 2017-08-26 DIAGNOSIS — N390 Urinary tract infection, site not specified: Secondary | ICD-10-CM | POA: Diagnosis not present

## 2017-08-26 DIAGNOSIS — N3281 Overactive bladder: Secondary | ICD-10-CM | POA: Diagnosis not present

## 2017-08-26 DIAGNOSIS — K589 Irritable bowel syndrome without diarrhea: Secondary | ICD-10-CM | POA: Diagnosis not present

## 2017-08-26 DIAGNOSIS — Z7189 Other specified counseling: Secondary | ICD-10-CM | POA: Diagnosis not present

## 2017-08-26 DIAGNOSIS — Z23 Encounter for immunization: Secondary | ICD-10-CM | POA: Diagnosis not present

## 2017-08-26 DIAGNOSIS — Z1389 Encounter for screening for other disorder: Secondary | ICD-10-CM | POA: Diagnosis not present

## 2017-08-26 DIAGNOSIS — E7849 Other hyperlipidemia: Secondary | ICD-10-CM | POA: Diagnosis not present

## 2017-08-26 DIAGNOSIS — Z0000 Encounter for general adult medical examination without abnormal findings: Secondary | ICD-10-CM | POA: Diagnosis not present

## 2017-08-31 DIAGNOSIS — H43813 Vitreous degeneration, bilateral: Secondary | ICD-10-CM | POA: Diagnosis not present

## 2017-08-31 DIAGNOSIS — H52223 Regular astigmatism, bilateral: Secondary | ICD-10-CM | POA: Diagnosis not present

## 2017-08-31 DIAGNOSIS — H524 Presbyopia: Secondary | ICD-10-CM | POA: Diagnosis not present

## 2017-08-31 DIAGNOSIS — H40013 Open angle with borderline findings, low risk, bilateral: Secondary | ICD-10-CM | POA: Diagnosis not present

## 2017-08-31 DIAGNOSIS — Z961 Presence of intraocular lens: Secondary | ICD-10-CM | POA: Diagnosis not present

## 2017-09-01 ENCOUNTER — Ambulatory Visit (INDEPENDENT_AMBULATORY_CARE_PROVIDER_SITE_OTHER): Payer: Medicare HMO | Admitting: Podiatry

## 2017-09-01 DIAGNOSIS — L600 Ingrowing nail: Secondary | ICD-10-CM | POA: Diagnosis not present

## 2017-09-05 NOTE — Progress Notes (Signed)
   Subjective: Patient presents today 2 weeks post ingrown nail permanent nail avulsion procedure to the medial border of the left great toe. Patient states that the toe and nail fold is feeling much better.   Past Medical History:  Diagnosis Date  . 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 Cervical stenosis of spine   . Chronic kidney disease    nocturia   . Irritable bowel syndrome   . Osteoporosis     Objective: Skin is warm, dry and supple. Nail and respective nail fold appears to be healing appropriately. Open wound to the associated nail fold with a granular wound base and moderate amount of fibrotic tissue. Minimal drainage noted. Mild erythema around the periungual region likely due to phenol chemical matricectomy.  Assessment: #1 postop permanent partial nail avulsion medial border of the left great toe #2 open wound periungual nail fold of respective digit.   Plan of care: #1 patient was evaluated  #2 debridement of open wound was performed to the periungual border of the respective toe using a currette. Antibiotic ointment and Band-Aid was applied. #3 patient is to return to clinic on a PRN basis.   Edrick Kins, DPM [facility]  Dr. Edrick Kins, [address]                                        [place], [place] [address]                Office [phone]  Fax [phone]

## 2017-09-08 ENCOUNTER — Telehealth: Payer: Self-pay | Admitting: *Deleted

## 2017-09-08 NOTE — Telephone Encounter (Signed)
Pt states she was checked last week for post-op ingrown toenail procedure, but the toe is red and mild swelling and painful in her shoes, more so than previously.09/08/2017-Left message informing pt that if she was continuing to have redness and minimal swelling it may be the last of the inflammation in the toe from the chemical applied to kill the nail bed, and to continue the epsom salt soaks twice daily for 4-6 weeks post toenail procedure continuing with the antibiotic ointment bandaid after each soak and to call with concerns.

## 2017-09-08 NOTE — Telephone Encounter (Signed)
Pt called stating after her follow up appt she was not instructed to soak, and she was very confused. I left message informing pt, due to the fax she was having problems and felt she had slight swelling that we instructed pt to begin epsom salt soaks again and since she was very concerned and confused she should make an appt [phone].

## 2017-09-13 ENCOUNTER — Ambulatory Visit: Payer: Medicare HMO | Admitting: Podiatry

## 2017-09-13 DIAGNOSIS — M7752 Other enthesopathy of left foot: Secondary | ICD-10-CM

## 2017-09-13 DIAGNOSIS — L600 Ingrowing nail: Secondary | ICD-10-CM

## 2017-09-15 ENCOUNTER — Other Ambulatory Visit: Payer: Self-pay | Admitting: Internal Medicine

## 2017-09-15 DIAGNOSIS — Z139 Encounter for screening, unspecified: Secondary | ICD-10-CM

## 2017-09-15 NOTE — Progress Notes (Signed)
   HPI: 76 year old female presents to the office today for evaluation of pain to the left great toe. She initially had a permanent partial nail avulsion procedure to the medial border of the nail about 6 weeks ago. She states she was doing well until about 1.5 weeks ago when the toe became red, swollen and sore. Wearing any shoes increases the pain. She denies alleviating factors. Patient is here for further evaluation and treatment.   Past Medical History:  Diagnosis Date  . 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 Cervical stenosis of spine   . Chronic kidney disease    nocturia   . Irritable bowel syndrome   . Osteoporosis      Physical Exam: General: The patient is alert and oriented x3 in no acute distress.  Dermatology: Skin is warm, dry and supple bilateral lower extremities. Negative for open lesions or macerations.  Vascular: Palpable pedal pulses bilaterally. No edema or erythema noted. Capillary refill within normal limits.  Neurological: Epicritic and protective threshold grossly intact bilaterally.   Musculoskeletal Exam: Pain with palpation of the 1st MPJ of the left foot. Range of motion within normal limits to all pedal and ankle joints bilateral. Muscle strength 5/5 in all groups bilateral.   Assessment: - 1st MPJ capsulitis left   Plan of Care:  - Patient evaluated. - Injection of 0.5 mLs Celestone Soluspan injected into the 1st MPJ of the left foot. - Recommended good, supportive shoe gear.  - Return to clinic when necessary.    Edrick Kins, DPM [facility]  Dr. Edrick Kins, DPM    [address], [place] [address]                Office [phone]  Fax [phone]

## 2017-10-14 ENCOUNTER — Ambulatory Visit: Payer: Medicare HMO

## 2017-10-15 ENCOUNTER — Ambulatory Visit
Admission: RE | Admit: 2017-10-15 | Discharge: 2017-10-15 | Disposition: A | Discharge status: Home / Self Care | Payer: Medicare HMO | Source: Ambulatory Visit | Attending: Internal Medicine | Admitting: Internal Medicine

## 2017-10-15 DIAGNOSIS — Z1231 Encounter for screening mammogram for malignant neoplasm of breast: Secondary | ICD-10-CM | POA: Diagnosis not present

## 2017-10-15 DIAGNOSIS — Z139 Encounter for screening, unspecified: Secondary | ICD-10-CM

## 2017-10-29 DIAGNOSIS — M79644 Pain in right finger(s): Secondary | ICD-10-CM | POA: Diagnosis not present

## 2017-10-29 DIAGNOSIS — M79645 Pain in left finger(s): Secondary | ICD-10-CM | POA: Diagnosis not present

## 2017-10-29 DIAGNOSIS — G8929 Other chronic pain: Secondary | ICD-10-CM | POA: Diagnosis not present

## 2017-11-04 DIAGNOSIS — R69 Illness, unspecified: Secondary | ICD-10-CM | POA: Diagnosis not present

## 2017-11-05 DIAGNOSIS — R300 Dysuria: Secondary | ICD-10-CM | POA: Diagnosis not present

## 2017-11-10 DIAGNOSIS — N3281 Overactive bladder: Secondary | ICD-10-CM | POA: Diagnosis not present

## 2017-11-10 DIAGNOSIS — M6281 Muscle weakness (generalized): Secondary | ICD-10-CM | POA: Diagnosis not present

## 2017-11-10 DIAGNOSIS — M6289 Other specified disorders of muscle: Secondary | ICD-10-CM | POA: Diagnosis not present

## 2017-11-10 DIAGNOSIS — R351 Nocturia: Secondary | ICD-10-CM | POA: Diagnosis not present

## 2017-11-10 DIAGNOSIS — R3914 Feeling of incomplete bladder emptying: Secondary | ICD-10-CM | POA: Diagnosis not present

## 2017-11-10 DIAGNOSIS — M62838 Other muscle spasm: Secondary | ICD-10-CM | POA: Diagnosis not present

## 2017-11-23 DIAGNOSIS — R8279 Other abnormal findings on microbiological examination of urine: Secondary | ICD-10-CM | POA: Diagnosis not present

## 2017-11-23 DIAGNOSIS — N3020 Other chronic cystitis without hematuria: Secondary | ICD-10-CM | POA: Diagnosis not present

## 2017-11-25 DIAGNOSIS — M62838 Other muscle spasm: Secondary | ICD-10-CM | POA: Diagnosis not present

## 2017-11-25 DIAGNOSIS — M6289 Other specified disorders of muscle: Secondary | ICD-10-CM | POA: Diagnosis not present

## 2017-11-25 DIAGNOSIS — M6281 Muscle weakness (generalized): Secondary | ICD-10-CM | POA: Diagnosis not present

## 2017-11-25 DIAGNOSIS — R300 Dysuria: Secondary | ICD-10-CM | POA: Diagnosis not present

## 2017-11-25 DIAGNOSIS — R3914 Feeling of incomplete bladder emptying: Secondary | ICD-10-CM | POA: Diagnosis not present

## 2017-11-25 DIAGNOSIS — R351 Nocturia: Secondary | ICD-10-CM | POA: Diagnosis not present

## 2017-12-08 DIAGNOSIS — D2239 Melanocytic nevi of other parts of face: Secondary | ICD-10-CM | POA: Diagnosis not present

## 2017-12-08 DIAGNOSIS — L720 Epidermal cyst: Secondary | ICD-10-CM | POA: Diagnosis not present

## 2017-12-08 DIAGNOSIS — L821 Other seborrheic keratosis: Secondary | ICD-10-CM | POA: Diagnosis not present

## 2017-12-08 DIAGNOSIS — D485 Neoplasm of uncertain behavior of skin: Secondary | ICD-10-CM | POA: Diagnosis not present

## 2017-12-08 DIAGNOSIS — Z85828 Personal history of other malignant neoplasm of skin: Secondary | ICD-10-CM | POA: Diagnosis not present

## 2017-12-09 DIAGNOSIS — R3914 Feeling of incomplete bladder emptying: Secondary | ICD-10-CM | POA: Diagnosis not present

## 2017-12-09 DIAGNOSIS — R351 Nocturia: Secondary | ICD-10-CM | POA: Diagnosis not present

## 2017-12-09 DIAGNOSIS — N952 Postmenopausal atrophic vaginitis: Secondary | ICD-10-CM | POA: Diagnosis not present

## 2017-12-09 DIAGNOSIS — M6281 Muscle weakness (generalized): Secondary | ICD-10-CM | POA: Diagnosis not present

## 2017-12-09 DIAGNOSIS — N3281 Overactive bladder: Secondary | ICD-10-CM | POA: Diagnosis not present

## 2017-12-09 DIAGNOSIS — M62838 Other muscle spasm: Secondary | ICD-10-CM | POA: Diagnosis not present

## 2017-12-09 DIAGNOSIS — M6289 Other specified disorders of muscle: Secondary | ICD-10-CM | POA: Diagnosis not present

## 2017-12-10 DIAGNOSIS — N200 Calculus of kidney: Secondary | ICD-10-CM | POA: Diagnosis not present

## 2017-12-10 DIAGNOSIS — N3000 Acute cystitis without hematuria: Secondary | ICD-10-CM | POA: Diagnosis not present

## 2018-01-06 DIAGNOSIS — R69 Illness, unspecified: Secondary | ICD-10-CM | POA: Diagnosis not present

## 2018-01-07 DIAGNOSIS — L97502 Non-pressure chronic ulcer of other part of unspecified foot with fat layer exposed: Secondary | ICD-10-CM | POA: Diagnosis not present

## 2018-02-07 ENCOUNTER — Ambulatory Visit: Payer: Medicare HMO | Admitting: Podiatry

## 2018-02-07 DIAGNOSIS — L97522 Non-pressure chronic ulcer of other part of left foot with fat layer exposed: Secondary | ICD-10-CM | POA: Diagnosis not present

## 2018-02-07 DIAGNOSIS — I70245 Atherosclerosis of native arteries of left leg with ulceration of other part of foot: Secondary | ICD-10-CM

## 2018-02-07 DIAGNOSIS — E0843 Diabetes mellitus due to underlying condition with diabetic autonomic (poly)neuropathy: Secondary | ICD-10-CM

## 2018-02-09 NOTE — Progress Notes (Signed)
   Subjective:  77 year old female presenting today with a chief complaint of a lesion noted to the left foot near the 1st MPJ that appeared several weeks ago. She reports associated pain of the wound. She has been seen by a dermatologist which has helped improve the lesion but it has not resolved it. Touching the area increases the pain. Patient is here for further evaluation and treatment.   Past Medical History:  Diagnosis Date  . 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 Cervical stenosis of spine   . Chronic kidney disease    nocturia   . Irritable bowel syndrome   . Osteoporosis       Objective/Physical Exam General: The patient is alert and oriented x3 in no acute distress.  Dermatology:  Wound #1 noted to the left foot measuring 0.2 x 0.2 x 0.1 cm (LxWxD).   To the noted ulceration(s), there is no eschar. There is a moderate amount of slough, fibrin, and necrotic tissue noted. Granulation tissue and wound base is red. There is a minimal amount of serosanguineous drainage noted. There is no exposed bone muscle-tendon ligament or joint. There is no malodor. Periwound integrity is intact. Skin is warm, dry and supple bilateral lower extremities.  Vascular: Palpable pedal pulses bilaterally. No edema or erythema noted. Capillary refill within normal limits.  Neurological: Epicritic and protective threshold diminished bilaterally.   Musculoskeletal Exam: Range of motion within normal limits to all pedal and ankle joints bilateral. Muscle strength 5/5 in all groups bilateral.   Assessment: #1 ulceration to the left foot secondary to diabetes mellitus #2 diabetes mellitus w/ peripheral neuropathy   Plan of Care:  #1 Patient was evaluated. #2 medically necessary excisional debridement including subcutaneous tissue was performed using a tissue nipper and a chisel blade. Excisional debridement of all the necrotic nonviable tissue down to healthy bleeding viable tissue was  performed with post-debridement measurements same as pre-. #3 the wound was cleansed and dry sterile dressing applied. #4 Continue applying antibiotic cream daily with a Band-Aid.  #5 patient is to return to clinic in 4 weeks.   Edrick Kins, DPM [facility]  Dr. Edrick Kins, [place]                                        [place], [place] [address]                Office [phone]  Fax [phone]

## 2018-03-16 ENCOUNTER — Ambulatory Visit: Payer: Medicare HMO | Admitting: Podiatry

## 2018-03-16 DIAGNOSIS — E0843 Diabetes mellitus due to underlying condition with diabetic autonomic (poly)neuropathy: Secondary | ICD-10-CM

## 2018-03-16 DIAGNOSIS — I70245 Atherosclerosis of native arteries of left leg with ulceration of other part of foot: Secondary | ICD-10-CM | POA: Diagnosis not present

## 2018-03-16 DIAGNOSIS — L97522 Non-pressure chronic ulcer of other part of left foot with fat layer exposed: Secondary | ICD-10-CM | POA: Diagnosis not present

## 2018-03-22 NOTE — Progress Notes (Signed)
   Subjective:  77 year old female presenting today for follow up evaluation of an ulceration of the left foot. She states the wound looks much improved. She denies any pain or modifying factors. Patient is here for further evaluation and treatment.   Past Medical History:  Diagnosis Date  . 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 Cervical stenosis of spine   . Chronic kidney disease    nocturia   . Irritable bowel syndrome   . Osteoporosis       Objective/Physical Exam General: The patient is alert and oriented x3 in no acute distress.  Dermatology:  Wound noted to the left foot has healed. Complete re-epithelialization has occurred. No drainage noted. Skin is warm, dry and supple bilateral lower extremities.  Vascular: Palpable pedal pulses bilaterally. No edema or erythema noted. Capillary refill within normal limits.  Neurological: Epicritic and protective threshold diminished bilaterally.   Musculoskeletal Exam: Range of motion within normal limits to all pedal and ankle joints bilateral. Muscle strength 5/5 in all groups bilateral.   Assessment: #1 ulceration to the left foot secondary to diabetes mellitus - healed  #2 diabetes mellitus w/ peripheral neuropathy   Plan of Care:  #1 Patient was evaluated. #2 Recommended good shoe gear.  #3 stressed importance of maintaining and controlling her blood sugars.  #4 Return to clinic as needed.   Edrick Kins, DPM [facility]  Dr. Edrick Kins, [place]                                        [place], [place] [address]                Office [phone]  Fax [phone]

## 2018-04-21 DIAGNOSIS — T50Z95A Adverse effect of other vaccines and biological substances, initial encounter: Secondary | ICD-10-CM | POA: Diagnosis not present

## 2018-04-21 DIAGNOSIS — Z6826 Body mass index (BMI) 26.0-26.9, adult: Secondary | ICD-10-CM | POA: Diagnosis not present

## 2018-04-21 DIAGNOSIS — R21 Rash and other nonspecific skin eruption: Secondary | ICD-10-CM | POA: Diagnosis not present

## 2018-05-11 DIAGNOSIS — L300 Nummular dermatitis: Secondary | ICD-10-CM | POA: Diagnosis not present

## 2018-05-17 DIAGNOSIS — N3000 Acute cystitis without hematuria: Secondary | ICD-10-CM | POA: Diagnosis not present

## 2018-05-17 DIAGNOSIS — N3020 Other chronic cystitis without hematuria: Secondary | ICD-10-CM | POA: Diagnosis not present

## 2018-05-17 DIAGNOSIS — R300 Dysuria: Secondary | ICD-10-CM | POA: Diagnosis not present

## 2018-05-20 DIAGNOSIS — R300 Dysuria: Secondary | ICD-10-CM | POA: Diagnosis not present

## 2018-06-04 DIAGNOSIS — Z23 Encounter for immunization: Secondary | ICD-10-CM | POA: Diagnosis not present

## 2018-07-13 DIAGNOSIS — R69 Illness, unspecified: Secondary | ICD-10-CM | POA: Diagnosis not present

## 2018-07-13 DIAGNOSIS — Z6825 Body mass index (BMI) 25.0-25.9, adult: Secondary | ICD-10-CM | POA: Diagnosis not present

## 2018-07-13 DIAGNOSIS — M546 Pain in thoracic spine: Secondary | ICD-10-CM | POA: Diagnosis not present

## 2018-07-20 DIAGNOSIS — D225 Melanocytic nevi of trunk: Secondary | ICD-10-CM | POA: Diagnosis not present

## 2018-07-20 DIAGNOSIS — L570 Actinic keratosis: Secondary | ICD-10-CM | POA: Diagnosis not present

## 2018-07-20 DIAGNOSIS — D1801 Hemangioma of skin and subcutaneous tissue: Secondary | ICD-10-CM | POA: Diagnosis not present

## 2018-07-20 DIAGNOSIS — L820 Inflamed seborrheic keratosis: Secondary | ICD-10-CM | POA: Diagnosis not present

## 2018-07-20 DIAGNOSIS — D2261 Melanocytic nevi of right upper limb, including shoulder: Secondary | ICD-10-CM | POA: Diagnosis not present

## 2018-07-20 DIAGNOSIS — D485 Neoplasm of uncertain behavior of skin: Secondary | ICD-10-CM | POA: Diagnosis not present

## 2018-07-20 DIAGNOSIS — L821 Other seborrheic keratosis: Secondary | ICD-10-CM | POA: Diagnosis not present

## 2018-07-20 DIAGNOSIS — L720 Epidermal cyst: Secondary | ICD-10-CM | POA: Diagnosis not present

## 2018-07-20 DIAGNOSIS — L814 Other melanin hyperpigmentation: Secondary | ICD-10-CM | POA: Diagnosis not present

## 2018-07-25 DIAGNOSIS — N3000 Acute cystitis without hematuria: Secondary | ICD-10-CM | POA: Diagnosis not present

## 2018-08-08 DIAGNOSIS — N3000 Acute cystitis without hematuria: Secondary | ICD-10-CM | POA: Diagnosis not present

## 2018-08-10 DIAGNOSIS — L0233 Carbuncle of buttock: Secondary | ICD-10-CM | POA: Diagnosis not present

## 2018-08-18 DIAGNOSIS — M810 Age-related osteoporosis without current pathological fracture: Secondary | ICD-10-CM | POA: Diagnosis not present

## 2018-08-18 DIAGNOSIS — N390 Urinary tract infection, site not specified: Secondary | ICD-10-CM | POA: Diagnosis not present

## 2018-08-18 DIAGNOSIS — E7849 Other hyperlipidemia: Secondary | ICD-10-CM | POA: Diagnosis not present

## 2018-08-23 DIAGNOSIS — Z1212 Encounter for screening for malignant neoplasm of rectum: Secondary | ICD-10-CM | POA: Diagnosis not present

## 2018-08-29 DIAGNOSIS — R350 Frequency of micturition: Secondary | ICD-10-CM | POA: Diagnosis not present

## 2018-08-29 DIAGNOSIS — R300 Dysuria: Secondary | ICD-10-CM | POA: Diagnosis not present

## 2018-08-29 DIAGNOSIS — R8279 Other abnormal findings on microbiological examination of urine: Secondary | ICD-10-CM | POA: Diagnosis not present

## 2018-08-29 DIAGNOSIS — N3000 Acute cystitis without hematuria: Secondary | ICD-10-CM | POA: Diagnosis not present

## 2018-09-01 DIAGNOSIS — N3281 Overactive bladder: Secondary | ICD-10-CM | POA: Diagnosis not present

## 2018-09-01 DIAGNOSIS — N390 Urinary tract infection, site not specified: Secondary | ICD-10-CM | POA: Diagnosis not present

## 2018-09-01 DIAGNOSIS — M810 Age-related osteoporosis without current pathological fracture: Secondary | ICD-10-CM | POA: Diagnosis not present

## 2018-09-01 DIAGNOSIS — Z6826 Body mass index (BMI) 26.0-26.9, adult: Secondary | ICD-10-CM | POA: Diagnosis not present

## 2018-09-01 DIAGNOSIS — G44209 Tension-type headache, unspecified, not intractable: Secondary | ICD-10-CM | POA: Diagnosis not present

## 2018-09-01 DIAGNOSIS — E7849 Other hyperlipidemia: Secondary | ICD-10-CM | POA: Diagnosis not present

## 2018-09-01 DIAGNOSIS — K589 Irritable bowel syndrome without diarrhea: Secondary | ICD-10-CM | POA: Diagnosis not present

## 2018-09-01 DIAGNOSIS — Z1389 Encounter for screening for other disorder: Secondary | ICD-10-CM | POA: Diagnosis not present

## 2018-09-01 DIAGNOSIS — M546 Pain in thoracic spine: Secondary | ICD-10-CM | POA: Diagnosis not present

## 2018-09-01 DIAGNOSIS — Z0000 Encounter for general adult medical examination without abnormal findings: Secondary | ICD-10-CM | POA: Diagnosis not present

## 2018-09-06 DIAGNOSIS — H40013 Open angle with borderline findings, low risk, bilateral: Secondary | ICD-10-CM | POA: Diagnosis not present

## 2018-09-06 DIAGNOSIS — Z961 Presence of intraocular lens: Secondary | ICD-10-CM | POA: Diagnosis not present

## 2018-09-06 DIAGNOSIS — H43813 Vitreous degeneration, bilateral: Secondary | ICD-10-CM | POA: Diagnosis not present

## 2018-09-29 DIAGNOSIS — B373 Candidiasis of vulva and vagina: Secondary | ICD-10-CM | POA: Diagnosis not present

## 2018-09-29 DIAGNOSIS — N3000 Acute cystitis without hematuria: Secondary | ICD-10-CM | POA: Diagnosis not present

## 2018-09-29 DIAGNOSIS — N952 Postmenopausal atrophic vaginitis: Secondary | ICD-10-CM | POA: Diagnosis not present

## 2018-09-29 DIAGNOSIS — R300 Dysuria: Secondary | ICD-10-CM | POA: Diagnosis not present

## 2018-09-29 DIAGNOSIS — R8279 Other abnormal findings on microbiological examination of urine: Secondary | ICD-10-CM | POA: Diagnosis not present

## 2018-10-11 DIAGNOSIS — N952 Postmenopausal atrophic vaginitis: Secondary | ICD-10-CM | POA: Diagnosis not present

## 2018-10-11 DIAGNOSIS — N812 Incomplete uterovaginal prolapse: Secondary | ICD-10-CM | POA: Diagnosis not present

## 2018-10-11 DIAGNOSIS — N3000 Acute cystitis without hematuria: Secondary | ICD-10-CM | POA: Diagnosis not present

## 2018-10-19 DIAGNOSIS — L723 Sebaceous cyst: Secondary | ICD-10-CM | POA: Diagnosis not present

## 2018-10-19 DIAGNOSIS — D0471 Carcinoma in situ of skin of right lower limb, including hip: Secondary | ICD-10-CM | POA: Diagnosis not present

## 2018-10-19 DIAGNOSIS — L905 Scar conditions and fibrosis of skin: Secondary | ICD-10-CM | POA: Diagnosis not present

## 2018-10-19 DIAGNOSIS — L821 Other seborrheic keratosis: Secondary | ICD-10-CM | POA: Diagnosis not present

## 2018-10-19 DIAGNOSIS — D485 Neoplasm of uncertain behavior of skin: Secondary | ICD-10-CM | POA: Diagnosis not present

## 2018-10-26 ENCOUNTER — Other Ambulatory Visit: Payer: Self-pay | Admitting: Internal Medicine

## 2018-10-26 DIAGNOSIS — Z1231 Encounter for screening mammogram for malignant neoplasm of breast: Secondary | ICD-10-CM

## 2018-10-26 DIAGNOSIS — M1990 Unspecified osteoarthritis, unspecified site: Secondary | ICD-10-CM | POA: Insufficient documentation

## 2018-10-26 DIAGNOSIS — K589 Irritable bowel syndrome without diarrhea: Secondary | ICD-10-CM | POA: Insufficient documentation

## 2018-10-26 DIAGNOSIS — N390 Urinary tract infection, site not specified: Secondary | ICD-10-CM | POA: Insufficient documentation

## 2018-10-26 DIAGNOSIS — M810 Age-related osteoporosis without current pathological fracture: Secondary | ICD-10-CM | POA: Insufficient documentation

## 2018-10-28 DIAGNOSIS — N812 Incomplete uterovaginal prolapse: Secondary | ICD-10-CM | POA: Diagnosis not present

## 2018-10-28 DIAGNOSIS — R300 Dysuria: Secondary | ICD-10-CM | POA: Diagnosis not present

## 2018-10-28 DIAGNOSIS — N3000 Acute cystitis without hematuria: Secondary | ICD-10-CM | POA: Diagnosis not present

## 2018-10-28 DIAGNOSIS — N952 Postmenopausal atrophic vaginitis: Secondary | ICD-10-CM | POA: Diagnosis not present

## 2018-11-01 DIAGNOSIS — N8182 Incompetence or weakening of pubocervical tissue: Secondary | ICD-10-CM | POA: Diagnosis not present

## 2018-11-01 DIAGNOSIS — N8189 Other female genital prolapse: Secondary | ICD-10-CM | POA: Diagnosis not present

## 2018-11-03 DIAGNOSIS — N8189 Other female genital prolapse: Secondary | ICD-10-CM | POA: Diagnosis not present

## 2018-11-03 DIAGNOSIS — N8182 Incompetence or weakening of pubocervical tissue: Secondary | ICD-10-CM | POA: Diagnosis not present

## 2018-11-11 DIAGNOSIS — N3020 Other chronic cystitis without hematuria: Secondary | ICD-10-CM | POA: Diagnosis not present

## 2018-11-11 DIAGNOSIS — R351 Nocturia: Secondary | ICD-10-CM | POA: Diagnosis not present

## 2018-11-11 DIAGNOSIS — R3914 Feeling of incomplete bladder emptying: Secondary | ICD-10-CM | POA: Diagnosis not present

## 2018-11-15 DIAGNOSIS — N8182 Incompetence or weakening of pubocervical tissue: Secondary | ICD-10-CM | POA: Diagnosis not present

## 2018-11-15 DIAGNOSIS — N8189 Other female genital prolapse: Secondary | ICD-10-CM | POA: Diagnosis not present

## 2018-11-23 ENCOUNTER — Ambulatory Visit: Payer: Medicare HMO

## 2018-12-02 DIAGNOSIS — R351 Nocturia: Secondary | ICD-10-CM | POA: Diagnosis not present

## 2018-12-02 DIAGNOSIS — N952 Postmenopausal atrophic vaginitis: Secondary | ICD-10-CM | POA: Diagnosis not present

## 2018-12-02 DIAGNOSIS — N3000 Acute cystitis without hematuria: Secondary | ICD-10-CM | POA: Diagnosis not present

## 2018-12-02 DIAGNOSIS — N3020 Other chronic cystitis without hematuria: Secondary | ICD-10-CM | POA: Diagnosis not present

## 2018-12-02 DIAGNOSIS — N3281 Overactive bladder: Secondary | ICD-10-CM | POA: Diagnosis not present

## 2018-12-07 DIAGNOSIS — N8182 Incompetence or weakening of pubocervical tissue: Secondary | ICD-10-CM | POA: Diagnosis not present

## 2018-12-20 ENCOUNTER — Ambulatory Visit: Payer: Medicare HMO

## 2019-01-20 ENCOUNTER — Ambulatory Visit: Payer: Medicare HMO

## 2019-01-25 DIAGNOSIS — Z85828 Personal history of other malignant neoplasm of skin: Secondary | ICD-10-CM | POA: Diagnosis not present

## 2019-01-25 DIAGNOSIS — L821 Other seborrheic keratosis: Secondary | ICD-10-CM | POA: Diagnosis not present

## 2019-01-25 DIAGNOSIS — L570 Actinic keratosis: Secondary | ICD-10-CM | POA: Diagnosis not present

## 2019-03-06 DIAGNOSIS — Z20828 Contact with and (suspected) exposure to other viral communicable diseases: Secondary | ICD-10-CM | POA: Diagnosis not present

## 2019-03-13 ENCOUNTER — Ambulatory Visit: Payer: Medicare HMO

## 2019-04-19 DIAGNOSIS — Z20828 Contact with and (suspected) exposure to other viral communicable diseases: Secondary | ICD-10-CM | POA: Diagnosis not present

## 2019-04-27 ENCOUNTER — Ambulatory Visit
Admission: RE | Admit: 2019-04-27 | Discharge: 2019-04-27 | Disposition: A | Discharge status: Home / Self Care | Payer: Medicare HMO | Source: Ambulatory Visit | Attending: Internal Medicine | Admitting: Internal Medicine

## 2019-04-27 ENCOUNTER — Other Ambulatory Visit: Payer: Self-pay

## 2019-04-27 DIAGNOSIS — Z1231 Encounter for screening mammogram for malignant neoplasm of breast: Secondary | ICD-10-CM

## 2019-05-09 DIAGNOSIS — R69 Illness, unspecified: Secondary | ICD-10-CM | POA: Diagnosis not present

## 2019-05-09 DIAGNOSIS — G620 Drug-induced polyneuropathy: Secondary | ICD-10-CM | POA: Diagnosis not present

## 2019-05-09 DIAGNOSIS — L271 Localized skin eruption due to drugs and medicaments taken internally: Secondary | ICD-10-CM | POA: Diagnosis not present

## 2019-06-08 DIAGNOSIS — L271 Localized skin eruption due to drugs and medicaments taken internally: Secondary | ICD-10-CM | POA: Diagnosis not present

## 2019-06-08 DIAGNOSIS — G620 Drug-induced polyneuropathy: Secondary | ICD-10-CM | POA: Diagnosis not present

## 2019-06-08 DIAGNOSIS — R69 Illness, unspecified: Secondary | ICD-10-CM | POA: Diagnosis not present

## 2019-06-09 DIAGNOSIS — H6691 Otitis media, unspecified, right ear: Secondary | ICD-10-CM | POA: Diagnosis not present

## 2019-06-09 DIAGNOSIS — H8110 Benign paroxysmal vertigo, unspecified ear: Secondary | ICD-10-CM | POA: Diagnosis not present

## 2019-06-09 DIAGNOSIS — M26601 Right temporomandibular joint disorder, unspecified: Secondary | ICD-10-CM | POA: Diagnosis not present

## 2019-06-27 DIAGNOSIS — R69 Illness, unspecified: Secondary | ICD-10-CM | POA: Diagnosis not present

## 2019-06-27 DIAGNOSIS — G620 Drug-induced polyneuropathy: Secondary | ICD-10-CM | POA: Diagnosis not present

## 2019-06-27 DIAGNOSIS — L271 Localized skin eruption due to drugs and medicaments taken internally: Secondary | ICD-10-CM | POA: Diagnosis not present

## 2019-07-26 DIAGNOSIS — D1801 Hemangioma of skin and subcutaneous tissue: Secondary | ICD-10-CM | POA: Diagnosis not present

## 2019-07-26 DIAGNOSIS — L814 Other melanin hyperpigmentation: Secondary | ICD-10-CM | POA: Diagnosis not present

## 2019-07-26 DIAGNOSIS — L820 Inflamed seborrheic keratosis: Secondary | ICD-10-CM | POA: Diagnosis not present

## 2019-07-26 DIAGNOSIS — L821 Other seborrheic keratosis: Secondary | ICD-10-CM | POA: Diagnosis not present

## 2019-07-26 DIAGNOSIS — L918 Other hypertrophic disorders of the skin: Secondary | ICD-10-CM | POA: Diagnosis not present

## 2019-07-26 DIAGNOSIS — L570 Actinic keratosis: Secondary | ICD-10-CM | POA: Diagnosis not present

## 2019-07-26 DIAGNOSIS — L720 Epidermal cyst: Secondary | ICD-10-CM | POA: Diagnosis not present

## 2019-09-01 DIAGNOSIS — M810 Age-related osteoporosis without current pathological fracture: Secondary | ICD-10-CM | POA: Diagnosis not present

## 2019-09-01 DIAGNOSIS — E7849 Other hyperlipidemia: Secondary | ICD-10-CM | POA: Diagnosis not present

## 2019-09-07 DIAGNOSIS — Z1331 Encounter for screening for depression: Secondary | ICD-10-CM | POA: Diagnosis not present

## 2019-09-07 DIAGNOSIS — Z0000 Encounter for general adult medical examination without abnormal findings: Secondary | ICD-10-CM | POA: Diagnosis not present

## 2019-09-07 DIAGNOSIS — E785 Hyperlipidemia, unspecified: Secondary | ICD-10-CM | POA: Diagnosis not present

## 2019-09-07 DIAGNOSIS — Z1339 Encounter for screening examination for other mental health and behavioral disorders: Secondary | ICD-10-CM | POA: Diagnosis not present

## 2019-09-07 DIAGNOSIS — M810 Age-related osteoporosis without current pathological fracture: Secondary | ICD-10-CM | POA: Diagnosis not present

## 2019-09-07 DIAGNOSIS — N329 Bladder disorder, unspecified: Secondary | ICD-10-CM | POA: Diagnosis not present

## 2019-09-07 DIAGNOSIS — N390 Urinary tract infection, site not specified: Secondary | ICD-10-CM | POA: Diagnosis not present

## 2019-09-07 DIAGNOSIS — M26601 Right temporomandibular joint disorder, unspecified: Secondary | ICD-10-CM | POA: Diagnosis not present

## 2019-09-08 DIAGNOSIS — H40023 Open angle with borderline findings, high risk, bilateral: Secondary | ICD-10-CM | POA: Diagnosis not present

## 2019-09-08 DIAGNOSIS — H43813 Vitreous degeneration, bilateral: Secondary | ICD-10-CM | POA: Diagnosis not present

## 2019-09-08 DIAGNOSIS — Z961 Presence of intraocular lens: Secondary | ICD-10-CM | POA: Diagnosis not present

## 2019-09-19 DIAGNOSIS — Z0000 Encounter for general adult medical examination without abnormal findings: Secondary | ICD-10-CM | POA: Diagnosis not present

## 2019-09-27 DIAGNOSIS — Z20828 Contact with and (suspected) exposure to other viral communicable diseases: Secondary | ICD-10-CM | POA: Diagnosis not present

## 2019-10-11 DIAGNOSIS — G8929 Other chronic pain: Secondary | ICD-10-CM | POA: Diagnosis not present

## 2019-10-11 DIAGNOSIS — M79645 Pain in left finger(s): Secondary | ICD-10-CM | POA: Diagnosis not present

## 2019-10-11 DIAGNOSIS — M1811 Unilateral primary osteoarthritis of first carpometacarpal joint, right hand: Secondary | ICD-10-CM | POA: Diagnosis not present

## 2019-10-11 DIAGNOSIS — M1812 Unilateral primary osteoarthritis of first carpometacarpal joint, left hand: Secondary | ICD-10-CM | POA: Diagnosis not present

## 2019-10-11 DIAGNOSIS — M79644 Pain in right finger(s): Secondary | ICD-10-CM | POA: Diagnosis not present

## 2019-10-13 ENCOUNTER — Ambulatory Visit: Payer: Medicare Other | Attending: Internal Medicine

## 2019-10-13 DIAGNOSIS — Z23 Encounter for immunization: Secondary | ICD-10-CM

## 2019-10-13 NOTE — Progress Notes (Signed)
   Covid-19 Vaccination Clinic  Name:  Alicia [place]    MRN: [id number] DOB: Dec 15, 1940  10/13/2019  Ms. Dobbins was observed post Covid-19 immunization for 15 minutes without incidence. She was provided with Vaccine Information Sheet and instruction to access the V-Safe system.   Ms. Ohlendorf was instructed to call 911 with any severe reactions post vaccine: Marland Kitchen Difficulty breathing  . Swelling of your face and throat  . A fast heartbeat  . A bad rash all over your body  . Dizziness and weakness    Immunizations Administered    Name Date Dose VIS Date Route   Pfizer COVID-19 Vaccine 10/13/2019 11:05 AM 0.3 mL 09/08/2019 Intramuscular   Manufacturer: [employer]   Lot: [id number]   [place]: [id number]

## 2019-10-23 ENCOUNTER — Ambulatory Visit: Payer: Medicare HMO

## 2019-11-02 ENCOUNTER — Ambulatory Visit: Payer: Medicare HMO | Attending: Internal Medicine

## 2019-11-02 DIAGNOSIS — Z23 Encounter for immunization: Secondary | ICD-10-CM | POA: Insufficient documentation

## 2019-11-02 NOTE — Progress Notes (Signed)
   Covid-19 Vaccination Clinic  Name:  Alicia [place]    MRN: [id number] DOB: [birth year]  11/02/2019  Alicia [place] was observed post Covid-19 immunization for 15 minutes without incidence. She was provided with Vaccine Information Sheet and instruction to access the V-Safe system.   Alicia [place] was instructed to call 911 with any severe reactions post vaccine: Marland Kitchen Difficulty breathing  . Swelling of your face and throat  . A fast heartbeat  . A bad rash all over your body  . Dizziness and weakness    Immunizations Administered    Name Date Dose VIS Date Route   Pfizer COVID-19 Vaccine 11/02/2019  1:50 PM 0.3 mL 09/08/2019 Intramuscular   Manufacturer: [place]   Lot: [id number]   [place]: [id number]

## 2019-11-08 DIAGNOSIS — M810 Age-related osteoporosis without current pathological fracture: Secondary | ICD-10-CM | POA: Diagnosis not present

## 2020-01-17 DIAGNOSIS — H6982 Other specified disorders of Eustachian tube, left ear: Secondary | ICD-10-CM | POA: Diagnosis not present

## 2020-01-17 DIAGNOSIS — H8110 Benign paroxysmal vertigo, unspecified ear: Secondary | ICD-10-CM | POA: Diagnosis not present

## 2020-01-29 DIAGNOSIS — H6121 Impacted cerumen, right ear: Secondary | ICD-10-CM | POA: Insufficient documentation

## 2020-01-29 DIAGNOSIS — H6982 Other specified disorders of Eustachian tube, left ear: Secondary | ICD-10-CM | POA: Insufficient documentation

## 2020-01-29 DIAGNOSIS — H938X2 Other specified disorders of left ear: Secondary | ICD-10-CM | POA: Diagnosis not present

## 2020-02-21 DIAGNOSIS — M542 Cervicalgia: Secondary | ICD-10-CM | POA: Diagnosis not present

## 2020-02-21 DIAGNOSIS — M19011 Primary osteoarthritis, right shoulder: Secondary | ICD-10-CM | POA: Diagnosis not present

## 2020-02-27 DIAGNOSIS — M79644 Pain in right finger(s): Secondary | ICD-10-CM | POA: Diagnosis not present

## 2020-02-27 DIAGNOSIS — M1811 Unilateral primary osteoarthritis of first carpometacarpal joint, right hand: Secondary | ICD-10-CM | POA: Diagnosis not present

## 2020-02-27 DIAGNOSIS — L608 Other nail disorders: Secondary | ICD-10-CM | POA: Diagnosis not present

## 2020-02-29 DIAGNOSIS — R070 Pain in throat: Secondary | ICD-10-CM | POA: Diagnosis not present

## 2020-02-29 DIAGNOSIS — J343 Hypertrophy of nasal turbinates: Secondary | ICD-10-CM | POA: Diagnosis not present

## 2020-02-29 DIAGNOSIS — H6982 Other specified disorders of Eustachian tube, left ear: Secondary | ICD-10-CM | POA: Diagnosis not present

## 2020-03-01 DIAGNOSIS — M542 Cervicalgia: Secondary | ICD-10-CM | POA: Diagnosis not present

## 2020-03-20 DIAGNOSIS — Z4689 Encounter for fitting and adjustment of other specified devices: Secondary | ICD-10-CM | POA: Diagnosis not present

## 2020-03-25 ENCOUNTER — Other Ambulatory Visit: Payer: Self-pay | Admitting: Internal Medicine

## 2020-03-25 DIAGNOSIS — Z1231 Encounter for screening mammogram for malignant neoplasm of breast: Secondary | ICD-10-CM

## 2020-03-26 DIAGNOSIS — M19011 Primary osteoarthritis, right shoulder: Secondary | ICD-10-CM | POA: Diagnosis not present

## 2020-03-26 DIAGNOSIS — M25511 Pain in right shoulder: Secondary | ICD-10-CM | POA: Diagnosis not present

## 2020-04-05 DIAGNOSIS — M25552 Pain in left hip: Secondary | ICD-10-CM | POA: Insufficient documentation

## 2020-04-05 DIAGNOSIS — M7062 Trochanteric bursitis, left hip: Secondary | ICD-10-CM | POA: Diagnosis not present

## 2020-04-17 DIAGNOSIS — Z4689 Encounter for fitting and adjustment of other specified devices: Secondary | ICD-10-CM | POA: Diagnosis not present

## 2020-04-26 DIAGNOSIS — M25552 Pain in left hip: Secondary | ICD-10-CM | POA: Diagnosis not present

## 2020-04-30 ENCOUNTER — Other Ambulatory Visit: Payer: Self-pay

## 2020-04-30 ENCOUNTER — Ambulatory Visit
Admission: RE | Admit: 2020-04-30 | Discharge: 2020-04-30 | Disposition: A | Discharge status: Home / Self Care | Payer: Medicare HMO | Source: Ambulatory Visit | Attending: Internal Medicine | Admitting: Internal Medicine

## 2020-04-30 DIAGNOSIS — Z1231 Encounter for screening mammogram for malignant neoplasm of breast: Secondary | ICD-10-CM | POA: Diagnosis not present

## 2020-05-14 DIAGNOSIS — H40023 Open angle with borderline findings, high risk, bilateral: Secondary | ICD-10-CM | POA: Diagnosis not present

## 2020-05-22 DIAGNOSIS — N8182 Incompetence or weakening of pubocervical tissue: Secondary | ICD-10-CM | POA: Diagnosis not present

## 2020-05-22 DIAGNOSIS — Z4689 Encounter for fitting and adjustment of other specified devices: Secondary | ICD-10-CM | POA: Diagnosis not present

## 2020-06-06 DIAGNOSIS — L03113 Cellulitis of right upper limb: Secondary | ICD-10-CM | POA: Diagnosis not present

## 2020-06-06 DIAGNOSIS — R351 Nocturia: Secondary | ICD-10-CM | POA: Diagnosis not present

## 2020-06-13 DIAGNOSIS — B429 Sporotrichosis, unspecified: Secondary | ICD-10-CM | POA: Diagnosis not present

## 2020-06-13 DIAGNOSIS — L03113 Cellulitis of right upper limb: Secondary | ICD-10-CM | POA: Diagnosis not present

## 2020-06-20 DIAGNOSIS — R69 Illness, unspecified: Secondary | ICD-10-CM | POA: Diagnosis not present

## 2020-07-23 DIAGNOSIS — L821 Other seborrheic keratosis: Secondary | ICD-10-CM | POA: Diagnosis not present

## 2020-07-23 DIAGNOSIS — L814 Other melanin hyperpigmentation: Secondary | ICD-10-CM | POA: Diagnosis not present

## 2020-07-23 DIAGNOSIS — L720 Epidermal cyst: Secondary | ICD-10-CM | POA: Diagnosis not present

## 2020-07-23 DIAGNOSIS — L570 Actinic keratosis: Secondary | ICD-10-CM | POA: Diagnosis not present

## 2020-07-23 DIAGNOSIS — L820 Inflamed seborrheic keratosis: Secondary | ICD-10-CM | POA: Diagnosis not present

## 2020-07-23 DIAGNOSIS — D692 Other nonthrombocytopenic purpura: Secondary | ICD-10-CM | POA: Diagnosis not present

## 2020-07-23 DIAGNOSIS — L918 Other hypertrophic disorders of the skin: Secondary | ICD-10-CM | POA: Diagnosis not present

## 2020-07-23 DIAGNOSIS — D1801 Hemangioma of skin and subcutaneous tissue: Secondary | ICD-10-CM | POA: Diagnosis not present

## 2020-08-21 ENCOUNTER — Encounter: Payer: Self-pay | Admitting: Podiatry

## 2020-08-21 ENCOUNTER — Ambulatory Visit: Payer: Medicare HMO | Admitting: Podiatry

## 2020-08-21 ENCOUNTER — Other Ambulatory Visit: Payer: Self-pay

## 2020-08-21 ENCOUNTER — Other Ambulatory Visit: Payer: Self-pay | Admitting: Podiatry

## 2020-08-21 ENCOUNTER — Ambulatory Visit (INDEPENDENT_AMBULATORY_CARE_PROVIDER_SITE_OTHER): Payer: Medicare HMO

## 2020-08-21 DIAGNOSIS — M205X2 Other deformities of toe(s) (acquired), left foot: Secondary | ICD-10-CM

## 2020-08-21 DIAGNOSIS — M7752 Other enthesopathy of left foot: Secondary | ICD-10-CM | POA: Diagnosis not present

## 2020-08-21 DIAGNOSIS — L97522 Non-pressure chronic ulcer of other part of left foot with fat layer exposed: Secondary | ICD-10-CM

## 2020-08-21 MED ORDER — MELOXICAM 15 MG PO TABS: 15.0000 mg | ORAL_TABLET | Freq: Every day | ORAL | 1 refills | Status: DC

## 2020-08-21 NOTE — Progress Notes (Signed)
   HPI: 79 y.o. female presenting today for new complaint regarding left great toe pain is been going on for several weeks now.  Patient denies a history of injury.  She has not done anything for treatment.  She presents for further treatment and evaluation  Past Medical History:  Diagnosis Date  . 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 Cervical stenosis of spine   . Chronic kidney disease    nocturia   . Irritable bowel syndrome   . Osteoporosis      Physical Exam: General: The patient is alert and oriented x3 in no acute distress.  Dermatology: Skin is warm, dry and supple bilateral lower extremities. Negative for open lesions or macerations.  Vascular: Palpable pedal pulses bilaterally. No edema or erythema noted. Capillary refill within normal limits.  Neurological: Epicritic and protective threshold grossly intact bilaterally.   Musculoskeletal Exam: Range of motion within normal limits to all pedal and ankle joints bilateral. Muscle strength 5/5 in all groups bilateral.  There is some pain on palpation and range of motion of the first MTPJ with crepitus to the left foot.  Radiographic Exam:  Normal osseous mineralization.  Joint space narrowing with degenerative changes and periarticular spurring noted to the first MTPJ left no fracture/dislocation/boney destruction.    Assessment: 1.  Hallux limitus left 2.  First MTPJ capsulitis left   Plan of Care:  1. Patient evaluated. X-Rays reviewed.  2.  Injection of 0.5 cc Celestone Soluspan injected into the first MTPJ left 3.  Prescription for meloxicam 15 mg daily as needed 4.  Recommend good supportive shoes 5.  Return to clinic as needed      Edrick Kins, DPM [facility]  Dr. Edrick Kins, DPM    [address], [place] [address]                Office [phone]  Fax [phone]

## 2020-09-13 DIAGNOSIS — H43813 Vitreous degeneration, bilateral: Secondary | ICD-10-CM | POA: Diagnosis not present

## 2020-09-13 DIAGNOSIS — Z961 Presence of intraocular lens: Secondary | ICD-10-CM | POA: Diagnosis not present

## 2020-09-13 DIAGNOSIS — H40023 Open angle with borderline findings, high risk, bilateral: Secondary | ICD-10-CM | POA: Diagnosis not present

## 2020-09-13 DIAGNOSIS — H02054 Trichiasis without entropian left upper eyelid: Secondary | ICD-10-CM | POA: Diagnosis not present

## 2020-09-13 DIAGNOSIS — H02051 Trichiasis without entropian right upper eyelid: Secondary | ICD-10-CM | POA: Diagnosis not present

## 2020-09-16 DIAGNOSIS — Z0000 Encounter for general adult medical examination without abnormal findings: Secondary | ICD-10-CM | POA: Diagnosis not present

## 2020-09-16 DIAGNOSIS — Z1331 Encounter for screening for depression: Secondary | ICD-10-CM | POA: Diagnosis not present

## 2020-09-16 DIAGNOSIS — E785 Hyperlipidemia, unspecified: Secondary | ICD-10-CM | POA: Diagnosis not present

## 2020-09-16 DIAGNOSIS — M810 Age-related osteoporosis without current pathological fracture: Secondary | ICD-10-CM | POA: Diagnosis not present

## 2020-09-16 DIAGNOSIS — K589 Irritable bowel syndrome without diarrhea: Secondary | ICD-10-CM | POA: Diagnosis not present

## 2020-09-16 DIAGNOSIS — G4700 Insomnia, unspecified: Secondary | ICD-10-CM | POA: Diagnosis not present

## 2020-09-16 DIAGNOSIS — R82998 Other abnormal findings in urine: Secondary | ICD-10-CM | POA: Diagnosis not present

## 2020-09-16 DIAGNOSIS — H8110 Benign paroxysmal vertigo, unspecified ear: Secondary | ICD-10-CM | POA: Diagnosis not present

## 2020-09-16 DIAGNOSIS — Z1339 Encounter for screening examination for other mental health and behavioral disorders: Secondary | ICD-10-CM | POA: Diagnosis not present

## 2020-09-16 DIAGNOSIS — H938X2 Other specified disorders of left ear: Secondary | ICD-10-CM | POA: Diagnosis not present

## 2020-09-16 DIAGNOSIS — N3281 Overactive bladder: Secondary | ICD-10-CM | POA: Diagnosis not present

## 2020-10-04 DIAGNOSIS — R8271 Bacteriuria: Secondary | ICD-10-CM | POA: Diagnosis not present

## 2020-10-04 DIAGNOSIS — N3281 Overactive bladder: Secondary | ICD-10-CM | POA: Diagnosis not present

## 2020-10-04 DIAGNOSIS — R3915 Urgency of urination: Secondary | ICD-10-CM | POA: Diagnosis not present

## 2020-10-04 DIAGNOSIS — R351 Nocturia: Secondary | ICD-10-CM | POA: Diagnosis not present

## 2020-10-10 DIAGNOSIS — M25552 Pain in left hip: Secondary | ICD-10-CM | POA: Diagnosis not present

## 2020-10-16 DIAGNOSIS — N8182 Incompetence or weakening of pubocervical tissue: Secondary | ICD-10-CM | POA: Diagnosis not present

## 2020-10-17 DIAGNOSIS — H40023 Open angle with borderline findings, high risk, bilateral: Secondary | ICD-10-CM | POA: Diagnosis not present

## 2020-10-17 DIAGNOSIS — H43813 Vitreous degeneration, bilateral: Secondary | ICD-10-CM | POA: Diagnosis not present

## 2020-10-17 DIAGNOSIS — Z961 Presence of intraocular lens: Secondary | ICD-10-CM | POA: Diagnosis not present

## 2020-11-05 DIAGNOSIS — M25552 Pain in left hip: Secondary | ICD-10-CM | POA: Diagnosis not present

## 2020-11-05 DIAGNOSIS — R351 Nocturia: Secondary | ICD-10-CM | POA: Diagnosis not present

## 2020-11-05 DIAGNOSIS — R350 Frequency of micturition: Secondary | ICD-10-CM | POA: Diagnosis not present

## 2020-11-12 DIAGNOSIS — R350 Frequency of micturition: Secondary | ICD-10-CM | POA: Diagnosis not present

## 2020-11-12 DIAGNOSIS — R3915 Urgency of urination: Secondary | ICD-10-CM | POA: Diagnosis not present

## 2020-11-14 DIAGNOSIS — M25552 Pain in left hip: Secondary | ICD-10-CM | POA: Diagnosis not present

## 2020-11-19 DIAGNOSIS — R350 Frequency of micturition: Secondary | ICD-10-CM | POA: Diagnosis not present

## 2020-11-19 DIAGNOSIS — R3915 Urgency of urination: Secondary | ICD-10-CM | POA: Diagnosis not present

## 2020-11-27 DIAGNOSIS — M25552 Pain in left hip: Secondary | ICD-10-CM | POA: Diagnosis not present

## 2020-11-29 DIAGNOSIS — R3915 Urgency of urination: Secondary | ICD-10-CM | POA: Diagnosis not present

## 2020-11-29 DIAGNOSIS — R350 Frequency of micturition: Secondary | ICD-10-CM | POA: Diagnosis not present

## 2020-12-03 DIAGNOSIS — R350 Frequency of micturition: Secondary | ICD-10-CM | POA: Diagnosis not present

## 2020-12-03 DIAGNOSIS — R3915 Urgency of urination: Secondary | ICD-10-CM | POA: Diagnosis not present

## 2020-12-10 DIAGNOSIS — H40023 Open angle with borderline findings, high risk, bilateral: Secondary | ICD-10-CM | POA: Diagnosis not present

## 2020-12-10 DIAGNOSIS — R3915 Urgency of urination: Secondary | ICD-10-CM | POA: Diagnosis not present

## 2020-12-10 DIAGNOSIS — H0288A Meibomian gland dysfunction right eye, upper and lower eyelids: Secondary | ICD-10-CM | POA: Diagnosis not present

## 2020-12-10 DIAGNOSIS — R350 Frequency of micturition: Secondary | ICD-10-CM | POA: Diagnosis not present

## 2020-12-10 DIAGNOSIS — H0288B Meibomian gland dysfunction left eye, upper and lower eyelids: Secondary | ICD-10-CM | POA: Diagnosis not present

## 2020-12-17 DIAGNOSIS — R350 Frequency of micturition: Secondary | ICD-10-CM | POA: Diagnosis not present

## 2020-12-17 DIAGNOSIS — R3915 Urgency of urination: Secondary | ICD-10-CM | POA: Diagnosis not present

## 2020-12-18 DIAGNOSIS — N8182 Incompetence or weakening of pubocervical tissue: Secondary | ICD-10-CM | POA: Diagnosis not present

## 2020-12-24 DIAGNOSIS — R350 Frequency of micturition: Secondary | ICD-10-CM | POA: Diagnosis not present

## 2020-12-24 DIAGNOSIS — R3915 Urgency of urination: Secondary | ICD-10-CM | POA: Diagnosis not present

## 2020-12-27 DIAGNOSIS — M25552 Pain in left hip: Secondary | ICD-10-CM | POA: Diagnosis not present

## 2020-12-31 DIAGNOSIS — R350 Frequency of micturition: Secondary | ICD-10-CM | POA: Diagnosis not present

## 2021-01-07 DIAGNOSIS — R350 Frequency of micturition: Secondary | ICD-10-CM | POA: Diagnosis not present

## 2021-01-14 DIAGNOSIS — R350 Frequency of micturition: Secondary | ICD-10-CM | POA: Diagnosis not present

## 2021-01-14 DIAGNOSIS — R3915 Urgency of urination: Secondary | ICD-10-CM | POA: Diagnosis not present

## 2021-01-21 DIAGNOSIS — R350 Frequency of micturition: Secondary | ICD-10-CM | POA: Diagnosis not present

## 2021-01-21 DIAGNOSIS — M79641 Pain in right hand: Secondary | ICD-10-CM | POA: Diagnosis not present

## 2021-01-21 DIAGNOSIS — R200 Anesthesia of skin: Secondary | ICD-10-CM | POA: Diagnosis not present

## 2021-01-21 DIAGNOSIS — M1811 Unilateral primary osteoarthritis of first carpometacarpal joint, right hand: Secondary | ICD-10-CM | POA: Diagnosis not present

## 2021-01-21 DIAGNOSIS — R351 Nocturia: Secondary | ICD-10-CM | POA: Diagnosis not present

## 2021-01-21 DIAGNOSIS — R202 Paresthesia of skin: Secondary | ICD-10-CM | POA: Diagnosis not present

## 2021-01-21 DIAGNOSIS — R531 Weakness: Secondary | ICD-10-CM | POA: Diagnosis not present

## 2021-01-21 DIAGNOSIS — R3915 Urgency of urination: Secondary | ICD-10-CM | POA: Diagnosis not present

## 2021-01-29 DIAGNOSIS — N8110 Cystocele, unspecified: Secondary | ICD-10-CM | POA: Diagnosis not present

## 2021-01-29 DIAGNOSIS — R300 Dysuria: Secondary | ICD-10-CM | POA: Diagnosis not present

## 2021-02-06 DIAGNOSIS — R350 Frequency of micturition: Secondary | ICD-10-CM | POA: Diagnosis not present

## 2021-02-21 DIAGNOSIS — R351 Nocturia: Secondary | ICD-10-CM | POA: Diagnosis not present

## 2021-02-21 DIAGNOSIS — N3281 Overactive bladder: Secondary | ICD-10-CM | POA: Diagnosis not present

## 2021-02-21 DIAGNOSIS — R8271 Bacteriuria: Secondary | ICD-10-CM | POA: Diagnosis not present

## 2021-02-21 DIAGNOSIS — N3020 Other chronic cystitis without hematuria: Secondary | ICD-10-CM | POA: Diagnosis not present

## 2021-02-26 DIAGNOSIS — N8182 Incompetence or weakening of pubocervical tissue: Secondary | ICD-10-CM | POA: Diagnosis not present

## 2021-03-04 ENCOUNTER — Other Ambulatory Visit: Payer: Self-pay | Admitting: Urology

## 2021-03-10 DIAGNOSIS — B078 Other viral warts: Secondary | ICD-10-CM | POA: Diagnosis not present

## 2021-03-10 DIAGNOSIS — D485 Neoplasm of uncertain behavior of skin: Secondary | ICD-10-CM | POA: Diagnosis not present

## 2021-03-24 DIAGNOSIS — R8271 Bacteriuria: Secondary | ICD-10-CM | POA: Diagnosis not present

## 2021-03-25 DIAGNOSIS — G629 Polyneuropathy, unspecified: Secondary | ICD-10-CM | POA: Diagnosis not present

## 2021-03-25 DIAGNOSIS — G5603 Carpal tunnel syndrome, bilateral upper limbs: Secondary | ICD-10-CM | POA: Diagnosis not present

## 2021-03-28 ENCOUNTER — Encounter (HOSPITAL_BASED_OUTPATIENT_CLINIC_OR_DEPARTMENT_OTHER): Payer: Self-pay | Admitting: Urology

## 2021-03-28 ENCOUNTER — Other Ambulatory Visit: Payer: Self-pay

## 2021-03-28 NOTE — Progress Notes (Signed)
Spoke w/ via phone for pre-op interview--- Pt Lab needs dos----  no             Lab results------ no COVID test -----patient states asymptomatic no test needed Arrive at ------- 0630 on  04-02-2021 NPO after MN NO Solid Food.  Clear liquids from MN until--- 0530 Med rec completed Medications to take morning of surgery ----- Protonix Diabetic medication ----- n/a Patient instructed no nail polish to be worn day of surgery Patient instructed to bring photo id and insurance card day of surgery Patient aware to have Driver (ride ) / caregiver for 24 hours after surgery -- sister-n-law, Sullivan Lone Patient Special Instructions ----- n/a Pre-Op special Istructions ----- n/a Patient verbalized understanding of instructions that were given at this phone interview. Patient denies shortness of breath, chest pain, fever, cough at this phone interview.

## 2021-04-01 DIAGNOSIS — M1811 Unilateral primary osteoarthritis of first carpometacarpal joint, right hand: Secondary | ICD-10-CM | POA: Diagnosis not present

## 2021-04-01 DIAGNOSIS — G5601 Carpal tunnel syndrome, right upper limb: Secondary | ICD-10-CM | POA: Diagnosis not present

## 2021-04-01 NOTE — Anesthesia Preprocedure Evaluation (Addendum)
Anesthesia Evaluation  Patient identified by MRN, date of birth, ID band Patient awake    Reviewed: Allergy & Precautions, NPO status , Patient's Chart, lab work & pertinent test results  Airway Mallampati: II  TM Distance: >3 FB     Dental   Pulmonary    breath sounds clear to auscultation       Cardiovascular negative cardio ROS   Rhythm:Regular Rate:Normal     Neuro/Psych  Neuromuscular disease    GI/Hepatic Neg liver ROS, GERD  ,  Endo/Other  negative endocrine ROS  Renal/GU negative Renal ROS     Musculoskeletal   Abdominal   Peds  Hematology   Anesthesia Other Findings   Reproductive/Obstetrics                            Anesthesia Physical Anesthesia Plan  ASA: 2  Anesthesia Plan: General   Post-op Pain Management:    Induction: Intravenous  PONV Risk Score and Plan: Ondansetron, Dexamethasone and Midazolam  Airway Management Planned: LMA  Additional Equipment:   Intra-op Plan:   Post-operative Plan: Extubation in OR  Informed Consent: I have reviewed the patients History and Physical, chart, labs and discussed the procedure including the risks, benefits and alternatives for the proposed anesthesia with the patient or authorized representative who has indicated his/her understanding and acceptance.     Dental advisory given  Plan Discussed with: Anesthesiologist and CRNA  Anesthesia Plan Comments:        Anesthesia Quick Evaluation

## 2021-04-02 ENCOUNTER — Ambulatory Visit (HOSPITAL_BASED_OUTPATIENT_CLINIC_OR_DEPARTMENT_OTHER): Payer: Medicare HMO | Admitting: Anesthesiology

## 2021-04-02 ENCOUNTER — Ambulatory Visit (HOSPITAL_BASED_OUTPATIENT_CLINIC_OR_DEPARTMENT_OTHER)
Admission: RE | Admit: 2021-04-02 | Discharge: 2021-04-02 | Disposition: A | Discharge status: Home / Self Care | Payer: Medicare HMO | Attending: Urology | Admitting: Urology

## 2021-04-02 ENCOUNTER — Encounter (HOSPITAL_BASED_OUTPATIENT_CLINIC_OR_DEPARTMENT_OTHER)
Admission: RE | Disposition: A | Discharge status: Home / Self Care | Payer: Self-pay | Source: Home / Self Care | Attending: Urology

## 2021-04-02 ENCOUNTER — Encounter (HOSPITAL_BASED_OUTPATIENT_CLINIC_OR_DEPARTMENT_OTHER): Payer: Self-pay | Admitting: Urology

## 2021-04-02 DIAGNOSIS — N3941 Urge incontinence: Secondary | ICD-10-CM | POA: Diagnosis not present

## 2021-04-02 DIAGNOSIS — G5602 Carpal tunnel syndrome, left upper limb: Secondary | ICD-10-CM | POA: Diagnosis not present

## 2021-04-02 DIAGNOSIS — Z91040 Latex allergy status: Secondary | ICD-10-CM | POA: Insufficient documentation

## 2021-04-02 DIAGNOSIS — N3281 Overactive bladder: Secondary | ICD-10-CM | POA: Diagnosis not present

## 2021-04-02 DIAGNOSIS — R351 Nocturia: Secondary | ICD-10-CM | POA: Diagnosis not present

## 2021-04-02 DIAGNOSIS — K219 Gastro-esophageal reflux disease without esophagitis: Secondary | ICD-10-CM | POA: Diagnosis not present

## 2021-04-02 DIAGNOSIS — K589 Irritable bowel syndrome without diarrhea: Secondary | ICD-10-CM | POA: Diagnosis not present

## 2021-04-02 HISTORY — DX: Gastro-esophageal reflux disease without esophagitis: K21.9

## 2021-04-02 HISTORY — PX: BOTOX INJECTION: SHX5754

## 2021-04-02 HISTORY — DX: Nocturia: R35.1

## 2021-04-02 HISTORY — DX: Urge incontinence: N39.41

## 2021-04-02 HISTORY — DX: Unspecified osteoarthritis, unspecified site: M19.90

## 2021-04-02 HISTORY — DX: Presence of urogenital implants: Z96.0

## 2021-04-02 HISTORY — DX: Other specified health status: Z78.9

## 2021-04-02 HISTORY — DX: Congenital pancreatic cyst: Q45.2

## 2021-04-02 SURGERY — BOTOX INJECTION
Anesthesia: General | Site: Bladder

## 2021-04-02 MED ORDER — ONABOTULINUMTOXINA 100 UNITS IJ SOLR
INTRAMUSCULAR | Status: DC | PRN
  Administered 2021-04-02: 100 [IU] via INTRAMUSCULAR

## 2021-04-02 MED ORDER — PROPOFOL 10 MG/ML IV BOLUS
INTRAVENOUS | Status: DC | PRN
  Administered 2021-04-02: 150 mg via INTRAVENOUS
  Administered 2021-04-02: 50 mg via INTRAVENOUS

## 2021-04-02 MED ORDER — CEFAZOLIN SODIUM-DEXTROSE 2-4 GM/100ML-% IV SOLN
2.0000 g | Freq: Once | INTRAVENOUS | Status: AC
  Administered 2021-04-02: 2 g via INTRAVENOUS

## 2021-04-02 MED ORDER — FENTANYL CITRATE (PF) 100 MCG/2ML IJ SOLN: 25.0000 ug | INTRAMUSCULAR | Status: DC | PRN

## 2021-04-02 MED ORDER — ONDANSETRON HCL 4 MG/2ML IJ SOLN
INTRAMUSCULAR | Status: AC
  Filled 2021-04-02: qty 2

## 2021-04-02 MED ORDER — PROPOFOL 10 MG/ML IV BOLUS
INTRAVENOUS | Status: AC
  Filled 2021-04-02: qty 40

## 2021-04-02 MED ORDER — FENTANYL CITRATE (PF) 100 MCG/2ML IJ SOLN
INTRAMUSCULAR | Status: DC | PRN
  Administered 2021-04-02 (×4): 25 ug via INTRAVENOUS

## 2021-04-02 MED ORDER — STERILE WATER FOR IRRIGATION IR SOLN
Status: DC | PRN
  Administered 2021-04-02: 3000 mL via INTRAVESICAL

## 2021-04-02 MED ORDER — ONDANSETRON HCL 4 MG/2ML IJ SOLN
INTRAMUSCULAR | Status: DC | PRN
  Administered 2021-04-02: 4 mg via INTRAVENOUS

## 2021-04-02 MED ORDER — FENTANYL CITRATE (PF) 100 MCG/2ML IJ SOLN
INTRAMUSCULAR | Status: AC
  Filled 2021-04-02: qty 2

## 2021-04-02 MED ORDER — SODIUM CHLORIDE (PF) 0.9 % IJ SOLN
INTRAMUSCULAR | Status: DC | PRN
  Administered 2021-04-02: 10 mL

## 2021-04-02 MED ORDER — CEFAZOLIN SODIUM-DEXTROSE 2-4 GM/100ML-% IV SOLN
INTRAVENOUS | Status: AC
  Filled 2021-04-02: qty 100

## 2021-04-02 MED ORDER — LACTATED RINGERS IV SOLN: INTRAVENOUS | Status: DC

## 2021-04-02 MED ORDER — LIDOCAINE HCL (PF) 2 % IJ SOLN
INTRAMUSCULAR | Status: AC
  Filled 2021-04-02: qty 5

## 2021-04-02 MED ORDER — LIDOCAINE HCL (CARDIAC) PF 100 MG/5ML IV SOSY
PREFILLED_SYRINGE | INTRAVENOUS | Status: DC | PRN
  Administered 2021-04-02: 100 mg via INTRAVENOUS

## 2021-04-02 SURGICAL SUPPLY — 18 items
BAG DRAIN URO-CYSTO SKYTR STRL (DRAIN) ×2 IMPLANT
BAG DRN UROCATH (DRAIN) ×1
CATH ROBINSON RED A/P 14FR (CATHETERS) IMPLANT
CLOTH BEACON ORANGE TIMEOUT ST (SAFETY) ×2 IMPLANT
ELECT REM PT RETURN 9FT ADLT (ELECTROSURGICAL)
ELECTRODE REM PT RTRN 9FT ADLT (ELECTROSURGICAL) IMPLANT
GLOVE SURG ENC MOIS LTX SZ7.5 (GLOVE) ×2 IMPLANT
GOWN STRL REUS W/TWL LRG LVL3 (GOWN DISPOSABLE) ×6 IMPLANT
KIT TURNOVER CYSTO (KITS) ×2 IMPLANT
MANIFOLD NEPTUNE II (INSTRUMENTS) ×2 IMPLANT
NDL SAFETY ECLIPSE 18X1.5 (NEEDLE) ×1 IMPLANT
NEEDLE ASPIRATION 22 (NEEDLE) ×2 IMPLANT
NEEDLE HYPO 18GX1.5 SHARP (NEEDLE) ×2
PACK CYSTO (CUSTOM PROCEDURE TRAY) ×2 IMPLANT
SYR 20ML LL LF (SYRINGE) ×2 IMPLANT
SYR CONTROL 10ML LL (SYRINGE) IMPLANT
TUBE CONNECTING 12X1/4 (SUCTIONS) ×2 IMPLANT
WATER STERILE IRR 3000ML UROMA (IV SOLUTION) ×2 IMPLANT

## 2021-04-02 NOTE — H&P (Signed)
Urology Preoperative H&P   Chief Complaint: Urge incontinence   History of Present Illness: Alicia [place] is a 80 y.o. female with a history of overactive bladder, urge incontinence and nocturia.   -Pessary since 2020--no UTI sxs since then--nocturia did not change after pecessary placement  -MUS in 2001  -Cystocele repair in 2005  -Currently on topical vaginal estrogen cream  -Denies alcohol or caffeine intake throughout the day or in the evenings  -Denies any prior issues with pedal edema or prior diuretic use.   UDS showed appropriate bladder sensation, compliance and contractility. The patient continues to struggle with urinary urgency, frequency q 2 hours and nocturia x3-4 despite PTNS and a host of OAB medications.   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   GERD (gastroesophageal reflux disease)    Irritable bowel syndrome    Nocturia    OA (osteoarthritis)    Osteoporosis    Presence of pessary    mangaged by gyn-- dr Radene Knee   Presence of surgical incision    03-28-2021   per pt had benign cyst removed from right arm above wrist 03-10-2021 still healing   Urgency incontinence    urologist-- dr Namiko Pritts    Past Surgical History:  Procedure Laterality Date   BLADDER SUSPENSION  2001   transvaginal sling and cystocele repair   BLEPHAROPLASTY W/ LASER Bilateral 12/2013   upper eyelids   CARPAL TUNNEL RELEASE Right 04/2009   AND RIGHT CUBITAL TUNNEL RELEASE   CATARACT EXTRACTION W/ INTRAOCULAR LENS IMPLANT Bilateral 2006   CESAREAN SECTION     x 2  last one 1972   COLONOSCOPY     last one 2010 approx.   CYSTOCELE REPAIR  2005   and vaginal correction   DILATION AND CURETTAGE OF UTERUS  1967   EXCISION/RELEASE BURSA HIP  11/25/2011   Procedure: EXCISION/RELEASE BURSA HIP;  Surgeon: Gearlean Alf, MD;  Location: [facility];  Service: Orthopedics;  Laterality: Right;  Right Hip Bursectomy  with Possible Tendon Repair   FINGER ARTHRODESIS Right 04/2016   right index finger joint   TONSILLECTOMY     child    Allergies:  Allergies  Allergen Reactions   Latex Hives   Robaxin [Methocarbamol] Other (See Comments)    incontinence    History reviewed. No pertinent family history.  Social History:  reports that she has never smoked. She has never used smokeless tobacco. She reports previous alcohol use. She reports that she does not use drugs.  ROS: A complete review of systems was performed.  All systems are negative except for pertinent findings as noted.  Physical Exam:  Vital signs in last 24 hours: Temp:  [98.3 F (36.8 C)] 98.3 F (36.8 C) (07/06 0653) Pulse Rate:  [81] 81 (07/06 0653) Resp:  [14] 14 (07/06 0653) BP: (126)/(70) 126/70 (07/06 0653) SpO2:  [97 %] 97 % (07/06 0653) Weight:  [61.1 kg] 61.1 kg (07/06 0653) Constitutional:  Alert and oriented, No acute distress Cardiovascular: Regular rate and rhythm, No JVD Respiratory: Normal respiratory effort, Lungs clear bilaterally GI: Abdomen is soft, nontender, nondistended, no abdominal masses GU: No CVA tenderness Lymphatic: No lymphadenopathy Neurologic: Grossly intact, no focal deficits Psychiatric: Normal mood and affect  Laboratory Data:  No results for input(s): WBC, HGB, HCT, PLT in the last 72 hours.  No results for input(s): NA, K, CL, GLUCOSE, BUN, CALCIUM,  CREATININE in the last 72 hours.  Invalid input(s): CO3   No results found for this or any previous visit (from the past 24 hour(s)). No results found for this or any previous visit (from the past 240 hour(s)).  Renal Function: No results for input(s): CREATININE in the last 168 hours. CrCl cannot be calculated (No successful lab value found.).  Radiologic Imaging: No results found.  I independently reviewed the above imaging studies.  Assessment and Plan [name] is a 80 y.o. female with refractory urge incontinence and  nocturia  -The risks, benefits and alternatives of cystoscopy with intravesical Botox injections was discussed the patient. Risk include, but are not limited to, bleeding, urinary tract infection, urinary retention requiring intermittent catheterization or indwelling catheter, MI, CVA, DVT and the inherent risk of general anesthesia. She voices understanding and wishes to proceed.    Ellison Hughs, MD 04/02/2021, 8:25 AM  [facility] Pager: [phone]

## 2021-04-02 NOTE — Op Note (Signed)
Operative Note  Preoperative diagnosis:  1.  Refractory urge incontinence   Postoperative diagnosis: 1.  Refractory urge incontinence  Procedure(s): 1.  Cystoscopy with intravesical Botox injections (100 units)  Surgeon: Ellison Hughs, MD  Assistants:  None  Anesthesia:  General  Complications:  None  EBL: Less than 5 mL  Specimens: 1.  None  Drains/Catheters: 1.  None  Intraoperative findings:   No intravesical lesions  Indication:  Alicia [place] is a 80 y.o. female with refractory urge incontinence despite several OAB medications and PTNS.  She has been consented for the above procedures, voices understanding and wishes to proceed.  Description of procedure:  After informed consent was obtained, the patient was brought to the operating room and general LMA anesthesia was administered. The patient was then placed in the dorsolithotomy position and prepped and draped in the usual sterile fashion. A timeout was performed. A 23 French rigid cystoscope was then inserted into the urethral meatus and advanced into the bladder under direct vision. A complete bladder survey revealed no intravesical pathology.  A total of 100 units of Botox diluted in 10 mL of sterile water was injected and 0.5 mL aliquots in a grid like fashion throughout the detrusor musculature.  There was no significant bleeding following the injections.  The patient's bladder was then partially drained.  She tolerated the procedure well and was transferred to the postanesthesia in stable condition.  Plan: Follow-up in 2 weeks for PVR

## 2021-04-02 NOTE — Discharge Instructions (Addendum)
CYSTOSCOPY HOME CARE INSTRUCTIONS  Activity: Rest for the remainder of the day.  Do not drive or operate equipment today.  You may resume normal activities in one to two days as instructed by your physician.   Meals: Drink plenty of liquids and eat light foods such as gelatin or soup this evening.  You may return to a normal meal plan tomorrow.  Return to Work: You may return to work in one to two days or as instructed by your physician.  Special Instructions / Symptoms: Call your physician if any of these symptoms occur:   -persistent or heavy bleeding  -bleeding which continues after first few urination  -large blood clots that are difficult to pass  -urine stream diminishes or stops completely  -fever equal to or higher than 101 degrees Farenheit.  -cloudy urine with a strong, foul odor  -severe pain  Females should always wipe from front to back after elimination.  You may feel some burning pain when you urinate.  This should disappear with time.  Applying moist heat to the lower abdomen or a hot tub bath may help relieve the pain.  Follow-Up / Date of Return Visit to Your Physician:  as instructed Call for an appointment to arrange follow-up.  Post Anesthesia Home Care Instructions  Activity: Get plenty of rest for the remainder of the day. A responsible individual must stay with you for 24 hours following the procedure.  For the next 24 hours, DO NOT: -Drive a car -Paediatric nurse -Drink alcoholic beverages -Take any medication unless instructed by your physician -Make any legal decisions or sign important papers.  Meals: Start with liquid foods such as gelatin or soup. Progress to regular foods as tolerated. Avoid greasy, spicy, heavy foods. If nausea and/or vomiting occur, drink only clear liquids until the nausea and/or vomiting subsides. Call your physician if vomiting continues.  Special Instructions/Symptoms: Your throat may feel dry or sore from the anesthesia  or the breathing tube placed in your throat during surgery. If this causes discomfort, gargle with warm salt water. The discomfort should disappear within 24 hours.

## 2021-04-02 NOTE — Anesthesia Procedure Notes (Signed)
Procedure Name: LMA Insertion Date/Time: 04/02/2021 8:45 AM Performed by: Justice Rocher, CRNA Pre-anesthesia Checklist: Patient identified, Emergency Drugs available, Suction available, Patient being monitored and Timeout performed Patient Re-evaluated:Patient Re-evaluated prior to induction Oxygen Delivery Method: Circle system utilized Preoxygenation: Pre-oxygenation with 100% oxygen Induction Type: IV induction Ventilation: Mask ventilation without difficulty LMA: LMA inserted LMA Size: 3.0 Number of attempts: 1 Airway Equipment and Method: Bite block Placement Confirmation: positive ETCO2, breath sounds checked- equal and bilateral and CO2 detector Tube secured with: Tape Dental Injury: Teeth and Oropharynx as per pre-operative assessment

## 2021-04-02 NOTE — Transfer of Care (Signed)
Immediate Anesthesia Transfer of Care Note  Patient: Alicia [place]  Procedure(s) Performed: Procedure(s) (LRB): BOTOX INJECTION WITH CYSTOSCOPY, 100 UNITS (N/A)  Patient Location: PACU  Anesthesia Type: General  Level of Consciousness: awake, sedated, patient cooperative and responds to stimulation  Airway & Oxygen Therapy: Patient Spontanous Breathing and Patient connected to [place] 02 and soft FM   Post-op Assessment: Report given to PACU RN, Post -op Vital signs reviewed and stable and Patient moving all extremities  Post vital signs: Reviewed and stable  Complications: No apparent anesthesia complications

## 2021-04-02 NOTE — Anesthesia Postprocedure Evaluation (Signed)
Anesthesia Post Note  Patient: Alicia [place]  Procedure(s) Performed: BOTOX INJECTION WITH CYSTOSCOPY, 100 UNITS (Bladder)     Patient location during evaluation: PACU Anesthesia Type: General Level of consciousness: awake Pain management: pain level controlled Vital Signs Assessment: post-procedure vital signs reviewed and stable Respiratory status: spontaneous breathing Cardiovascular status: stable Postop Assessment: no apparent nausea or vomiting Anesthetic complications: no   No notable events documented.  Last Vitals:  Vitals:   04/02/21 0915 04/02/21 0930  BP: 135/66 135/62  Pulse: 73 69  Resp: 17 17  Temp:    SpO2: 95% 100%    Last Pain:  Vitals:   04/02/21 0945  TempSrc:   PainSc: 0-No pain                 [name]

## 2021-04-03 ENCOUNTER — Other Ambulatory Visit: Payer: Self-pay | Admitting: Podiatrist

## 2021-04-03 ENCOUNTER — Other Ambulatory Visit: Payer: Self-pay

## 2021-04-03 ENCOUNTER — Ambulatory Visit (INDEPENDENT_AMBULATORY_CARE_PROVIDER_SITE_OTHER): Payer: Medicare HMO | Admitting: Podiatrist

## 2021-04-03 ENCOUNTER — Encounter (HOSPITAL_BASED_OUTPATIENT_CLINIC_OR_DEPARTMENT_OTHER): Payer: Self-pay | Admitting: Urology

## 2021-04-03 ENCOUNTER — Ambulatory Visit (INDEPENDENT_AMBULATORY_CARE_PROVIDER_SITE_OTHER): Payer: Medicare HMO

## 2021-04-03 DIAGNOSIS — M7752 Other enthesopathy of left foot: Secondary | ICD-10-CM | POA: Diagnosis not present

## 2021-04-03 DIAGNOSIS — M25572 Pain in left ankle and joints of left foot: Secondary | ICD-10-CM | POA: Diagnosis not present

## 2021-04-03 DIAGNOSIS — M7750 Other enthesopathy of unspecified foot: Secondary | ICD-10-CM

## 2021-04-03 NOTE — Patient Instructions (Signed)
Wear your boot when you will be up and on your foot for the next couple weeks then start to wean out of it to see how your ankle feels.  If you still need some support, try an elastic ankle brace (any pharmacy carries them)  Ice is also helpful-  ice with an ice pack for about 20  minutes a couple times a day.

## 2021-04-09 ENCOUNTER — Encounter: Payer: Self-pay | Admitting: Podiatrist

## 2021-04-09 DIAGNOSIS — N8182 Incompetence or weakening of pubocervical tissue: Secondary | ICD-10-CM | POA: Diagnosis not present

## 2021-04-09 DIAGNOSIS — M25572 Pain in left ankle and joints of left foot: Secondary | ICD-10-CM | POA: Diagnosis not present

## 2021-04-09 DIAGNOSIS — M7752 Other enthesopathy of left foot: Secondary | ICD-10-CM | POA: Diagnosis not present

## 2021-04-09 MED ORDER — DEXAMETHASONE SODIUM PHOSPHATE 120 MG/30ML IJ SOLN
4.0000 mg | Freq: Once | INTRAMUSCULAR | Status: AC
  Administered 2021-04-09: 4 mg via INTRA_ARTICULAR

## 2021-04-09 NOTE — Progress Notes (Signed)
Chief Complaint  Patient presents with   Ankle Pain    Left ankle pain lateral aspect after gardening 5 days ago.     HPI: Patient is 80 y.o. female who presents today for pain on the lateral side of the left ankle for 5 days.  She states she was gardening and was sitting on her foot/leg funny and after this activity has had ankle pain that has not gone away.  No other injury she can recall.   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     aspirin EC 81 MG tablet Take 81 mg by mouth every other day.     betamethasone acetate-betamethasone sodium phosphate (CELESTONE) 6 (3-3) MG/ML injection Inject into the articular space.     calcium carbonate (OS-CAL) 1250 (500 Ca) MG chewable tablet Chew by mouth.     celecoxib (CELEBREX) 200 MG capsule Take 200 mg by mouth daily as needed.     Cholecalciferol 25 MCG (1000 UT) capsule Take 1 capsule by mouth every morning.     citalopram (CELEXA) 20 MG tablet Take 20 mg by  mouth at bedtime.     Cyanocobalamin (B-12 PO) Take 1 tablet by mouth daily.     diclofenac Sodium (VOLTAREN) 1 % GEL Apply topically.     lidocaine (XYLOCAINE) 1 % (with preservative) injection by Infiltration route.     Multiple Vitamins-Minerals (CENTRUM SILVER 50+WOMEN PO) Take 1 tablet by mouth daily.     pantoprazole (PROTONIX) 40 MG tablet Take 40 mg by mouth daily.     Probiotic Product (ALIGN) 4 MG CAPS Take 4 mg by mouth daily.     simvastatin (ZOCOR) 20 MG tablet Take 20 mg by mouth every evening.     simvastatin (ZOCOR) 20 MG tablet Take by mouth.     triamcinolone cream (KENALOG) 0.1 % Apply topically.     VITAMIN D, CHOLECALCIFEROL, PO Take 1 tablet by mouth daily.     No current facility-administered medications on file prior to visit.    Allergies  Allergen Reactions   Latex Hives   Robaxin [Methocarbamol] Other (See Comments)    incontinence    Review of Systems No fevers, chills, nausea, muscle aches, no difficulty breathing, no calf pain, no chest pain or shortness of breath.   Physical Exam  GENERAL APPEARANCE: Alert, conversant. Appropriately groomed. No acute distress.   VASCULAR: Pedal pulses palpable DP and PT bilateral.  Capillary refill time is immediate to all digits,  Proximal to  distal cooling it warm to warm.  Digital perfusion adequate.   NEUROLOGIC: sensation is intact to 5.07 monofilament at 5/5 sites bilateral.  Light touch is intact bilateral, vibratory sensation intact bilateral  MUSCULOSKELETAL: acceptable muscle strength, tone and stability bilateral.  No gross boney pedal deformities noted.  No pain along the medial or lateral ankle at the deltoid or lateral ankle ligaments.  Pain with pressure at the sinus tarsi region left is palpated.   DERMATOLOGIC: skin is warm, supple, and dry.  No open lesions noted.  No rash, no pre ulcerative lesions. Digital nails are asymptomatic.    Xrays:  no acute osseous abnormalities seen on xray. Ankle  joint space appears normal.  No osteophytic spurring noted at the ankle joint.  Plantar heels spur noted and dorsal first metatarsal spur is seen.  Left  Assessment   1. Left ankle pain, unspecified chronicity   2. Capsulitis of ankle, left      Plan  Exam and xrays discussed with the patient.  I discussed her ankle pain and recommended an injection into the region of the sinus tarsi and also recommended she wear a fracture boot for a couple weeks (she has a boot at home)-- she agreed.  I prepped the site with alcohol and injected 4mg  dexamethasone with lidocaine plain into the sinus tarsi of the left ankle.  She tolerated this well.  She will wear the boot as much as she is able for the next week to 2 weeks.  If the pain does not subside in 2 weeks she is to call.  Otherwise I expect this to gradually go away for her.  She will call if any questions arise.

## 2021-04-14 ENCOUNTER — Other Ambulatory Visit: Payer: Self-pay

## 2021-04-14 ENCOUNTER — Ambulatory Visit: Payer: Medicare HMO | Admitting: Podiatry

## 2021-04-14 DIAGNOSIS — M25572 Pain in left ankle and joints of left foot: Secondary | ICD-10-CM | POA: Diagnosis not present

## 2021-04-14 MED ORDER — BETAMETHASONE SOD PHOS & ACET 6 (3-3) MG/ML IJ SUSP: 3.0000 mg | Freq: Once | INTRAMUSCULAR | Status: AC

## 2021-04-14 NOTE — Progress Notes (Signed)
   HPI: 80 y.o. female presenting today for follow-up evaluation of left foot and ankle pain.  Patient was last seen in the office on 04/03/2021 with Dr. Rolley Sims and she received an ankle injection.  Patient states that the injection helped significantly however she continues to have some tenderness to the lateral aspect of the ankle.  She has also been wearing an ankle brace that she purchased and it helps significantly.  She presents for further treatment and evaluation  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   GERD (gastroesophageal reflux disease)    Irritable bowel syndrome    Nocturia    OA (osteoarthritis)    Osteoporosis    Presence of pessary    mangaged by gyn-- dr Radene Knee   Presence of surgical incision    03-28-2021   per pt had benign cyst removed from right arm above wrist 03-10-2021 still healing   Urgency incontinence    urologist-- dr winter     Physical Exam: General: The patient is alert and oriented x3 in no acute distress.  Dermatology: Skin is warm, dry and supple bilateral lower extremities. Negative for open lesions or macerations.  Vascular: Palpable pedal pulses bilaterally. No edema or erythema noted. Capillary refill within normal limits.  Neurological: Epicritic and protective threshold grossly intact bilaterally.   Musculoskeletal Exam: Range of motion within normal limits to all pedal and ankle joints bilateral. Muscle strength 5/5 in all groups bilateral.  There are some pain on palpation of the lateral aspect of the left foot and ankle overlying the sinus tarsi   Assessment: 1.  Sinus tarsitis left   Plan of Care:  1. Patient evaluated. X-Rays reviewed that were taken last visit.  2.  Injection of 0.5 cc Celestone Soluspan injected into the sinus tarsi left 3.  Continue ankle brace daily 4.  Return to clinic as needed      Edrick Kins, DPM [facility]  Dr. Edrick Kins, DPM    [address], [place] [address]                Office [phone]  Fax [phone]

## 2021-04-17 DIAGNOSIS — R351 Nocturia: Secondary | ICD-10-CM | POA: Diagnosis not present

## 2021-04-17 DIAGNOSIS — N3281 Overactive bladder: Secondary | ICD-10-CM | POA: Diagnosis not present

## 2021-04-21 ENCOUNTER — Ambulatory Visit: Payer: Medicare HMO | Admitting: Podiatry

## 2021-04-22 ENCOUNTER — Other Ambulatory Visit: Payer: Self-pay | Admitting: Internal Medicine

## 2021-04-22 DIAGNOSIS — Z1231 Encounter for screening mammogram for malignant neoplasm of breast: Secondary | ICD-10-CM

## 2021-05-05 ENCOUNTER — Ambulatory Visit: Payer: Medicare HMO

## 2021-05-06 ENCOUNTER — Ambulatory Visit
Admission: RE | Admit: 2021-05-06 | Discharge: 2021-05-06 | Disposition: A | Discharge status: Home / Self Care | Payer: Medicare HMO | Source: Ambulatory Visit | Attending: Internal Medicine | Admitting: Internal Medicine

## 2021-05-06 ENCOUNTER — Other Ambulatory Visit: Payer: Self-pay

## 2021-05-06 DIAGNOSIS — Z1231 Encounter for screening mammogram for malignant neoplasm of breast: Secondary | ICD-10-CM

## 2021-05-30 DIAGNOSIS — M25552 Pain in left hip: Secondary | ICD-10-CM | POA: Diagnosis not present

## 2021-05-30 DIAGNOSIS — M545 Low back pain, unspecified: Secondary | ICD-10-CM | POA: Diagnosis not present

## 2021-06-03 DIAGNOSIS — R351 Nocturia: Secondary | ICD-10-CM | POA: Diagnosis not present

## 2021-06-03 DIAGNOSIS — R3914 Feeling of incomplete bladder emptying: Secondary | ICD-10-CM | POA: Diagnosis not present

## 2021-06-05 ENCOUNTER — Ambulatory Visit: Payer: Medicare HMO | Admitting: Physical Therapy

## 2021-06-16 DIAGNOSIS — R300 Dysuria: Secondary | ICD-10-CM | POA: Diagnosis not present

## 2021-06-18 DIAGNOSIS — N8182 Incompetence or weakening of pubocervical tissue: Secondary | ICD-10-CM | POA: Diagnosis not present

## 2021-06-20 ENCOUNTER — Encounter: Payer: Self-pay | Admitting: Physical Therapy

## 2021-06-20 ENCOUNTER — Other Ambulatory Visit: Payer: Self-pay

## 2021-06-20 ENCOUNTER — Ambulatory Visit: Payer: Medicare HMO | Attending: Internal Medicine | Admitting: Physical Therapy

## 2021-06-20 DIAGNOSIS — M545 Low back pain, unspecified: Secondary | ICD-10-CM | POA: Diagnosis not present

## 2021-06-20 DIAGNOSIS — M25552 Pain in left hip: Secondary | ICD-10-CM | POA: Diagnosis not present

## 2021-06-20 DIAGNOSIS — M6281 Muscle weakness (generalized): Secondary | ICD-10-CM | POA: Diagnosis not present

## 2021-06-20 DIAGNOSIS — R293 Abnormal posture: Secondary | ICD-10-CM | POA: Diagnosis not present

## 2021-06-20 NOTE — Patient Instructions (Signed)
Access Code: [email or web address] URL: [email or web address] Date: 06/20/2021 Prepared by: Venetia Night Burnham Trost  Exercises Quadruped on Forearms Hip Extension - 1 x daily - 7 x weekly - 1 sets - 10 reps Supine Bridge - 1 x daily - 7 x weekly - 2 sets - 10 reps Supine Pelvic Tilt with Straight Leg Raise - 1 x daily - 7 x weekly - 1 sets - 10 reps Supine Piriformis Stretch with Foot on Ground - 1 x daily - 7 x weekly - 1 sets - 2 reps - 20 hold Supine Lower Trunk Rotation - 1 x daily - 7 x weekly - 1 sets - 10 reps Clamshell - 1 x daily - 7 x weekly - 1 sets - 10 reps Sidelying Hip Abduction - 1 x daily - 7 x weekly - 1 sets - 10 reps Sidelying Quadriceps Stretch - 1 x daily - 7 x weekly - 1 sets - 2 reps - 20 hold  Trigger Point Dry Needling  What is Trigger Point Dry Needling (DN)? DN is a physical therapy technique used to treat muscle pain and dysfunction. Specifically, DN helps deactivate muscle trigger points (muscle knots).  A thin filiform needle is used to penetrate the skin and stimulate the underlying trigger point. The goal is for a local twitch response (LTR) to occur and for the trigger point to relax. No medication of any kind is injected during the procedure.   What Does Trigger Point Dry Needling Feel Like?  The procedure feels different for each individual patient. Some patients report that they do not actually feel the needle enter the skin and overall the process is not painful. Very mild bleeding may occur. However, many patients feel a deep cramping in the muscle in which the needle was inserted. This is the local twitch response.   How Will I feel after the treatment? Soreness is normal, and the onset of soreness may not occur for a few hours. Typically this soreness does not last longer than two days.  Bruising is uncommon, however; ice can be used to decrease any possible bruising.  In rare cases feeling tired or nauseous after the treatment is normal. In addition,  your symptoms may get worse before they get better, this period will typically not last longer than 24 hours.   What Can I do After My Treatment? Increase your hydration by drinking more water for the next 24 hours. You may place ice or heat on the areas treated that have become sore, however, do not use heat on inflamed or bruised areas. Heat often brings more relief post needling. You can continue your regular activities, but vigorous activity is not recommended initially after the treatment for 24 hours. DN is best combined with other physical therapy such as strengthening, stretching, and other therapies.    [facility] [address], [place], [place] [address] Phone # [phone] Fax [phone]

## 2021-06-20 NOTE — Therapy (Signed)
[facility] [address] [place], [place], [address] Phone: [phone]   Fax:  [phone]  Physical Therapy Evaluation  Patient Details  Name: Alicia Bradley MRN: [id number] Date of Birth: [birth year] Referring Provider (PT): Derl Barrow, [place]   Encounter Date: 06/20/2021   PT End of Session - 06/20/21 0845     Visit Number 1    Date for PT Re-Evaluation 08/15/21    Authorization Type [employer]    PT Start Time 0845    PT Stop Time 0932    PT Time Calculation (min) 47 min    Activity Tolerance Patient tolerated treatment well    Behavior During Therapy [facility] for tasks assessed/performed             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   GERD (gastroesophageal reflux disease)    Irritable bowel syndrome    Nocturia    OA (osteoarthritis)    Osteoporosis    Presence of pessary    mangaged by gyn-- dr Radene Knee   Presence of surgical incision    03-28-2021   per pt had benign cyst removed from right arm above wrist 03-10-2021 still healing   Urgency incontinence    urologist-- dr winter    Past Surgical History:  Procedure Laterality Date   BLADDER SUSPENSION  2001   transvaginal sling and cystocele repair   BLEPHAROPLASTY W/ LASER Bilateral 12/2013   upper eyelids   BOTOX INJECTION N/A 04/02/2021   Procedure: BOTOX INJECTION WITH CYSTOSCOPY, 100 UNITS;  Surgeon: Ceasar Mons, MD;  Location: [facility];  Service: Urology;  Laterality: N/A;  ONLY NEEDS 30 MIN   CARPAL TUNNEL RELEASE Right 04/2009   AND RIGHT CUBITAL TUNNEL RELEASE   CATARACT EXTRACTION W/ INTRAOCULAR LENS IMPLANT Bilateral 2006   CESAREAN SECTION     x 2  last one 1972   COLONOSCOPY     last one 2010 approx.   CYSTOCELE REPAIR  2005   and vaginal correction   DILATION AND CURETTAGE OF UTERUS  1967   EXCISION/RELEASE  BURSA HIP  11/25/2011   Procedure: EXCISION/RELEASE BURSA HIP;  Surgeon: Gearlean Alf, MD;  Location: [facility];  Service: Orthopedics;  Laterality: Right;  Right Hip Bursectomy with Possible Tendon Repair   FINGER ARTHRODESIS Right 04/2016   right index finger joint   TONSILLECTOMY     child    There were no vitals filed for this visit.    Subjective Assessment - 06/20/21 0844     Subjective Pt referred to OPPT for Lt sided LBP and hip pain.  Pain sometimes spreads to the Rt.  Back feels weak with bending and reaching.  Pain is described as brief and sharp with moving the wrong way. Started approx 6 weeks ago without known injury.  Has osteoporosis and scoliosis.  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    Pertinent History PMH: chronic bil hip pain, osteoporosis, scoliosis, c-sections x 2, cystocele repair    Limitations House hold activities;Lifting;Sitting    How long can you sit comfortably? varies    How long can you stand comfortably? better than sitting    How long can you walk comfortably? unlimited    Patient Stated Goals strengthen my stomach muscles and get rid of Lt sided pain  Currently in Pain? Yes    Pain Score 0-No pain   can reach a 10/10 if moves the wrong way   Pain Location Back    Pain Orientation Left;Lower;Posterior    Pain Descriptors / Indicators Sharp    Pain Type Chronic pain;Acute pain    Pain Radiating Towards buttock/hip, across low back into Rt side sometimes    Pain Onset More than a month ago    Pain Frequency Intermittent    Aggravating Factors  moving into flexion, reaching, bending    Pain Relieving Factors reposition posture to avoid painful movement, immediate relief    Effect of Pain on Daily Activities works through the pain                [facility] PT Assessment - 06/20/21 0001       Assessment   Medical Diagnosis M54.50 (ICD-10-CM) - Low back pain, unspecified    Referring Provider (PT) Edmisten, Ok Anis, PA    Onset Date/Surgical Date --   6 weeks ago   Hand Dominance Right    Next MD Visit yes, needs to push it back due to delayed start for PT      Precautions   Precaution Comments osteoporosis      Balance Screen   Has the patient fallen in the past 6 months No    Has the patient had a decrease in activity level because of a fear of falling?  No    Is the patient reluctant to leave their home because of a fear of falling?  No      Home Ecologist residence    Living Arrangements Spouse/significant other    Home Layout Two level      Prior Function   Level of [place] Retired    Leisure do home exercise videos, garden, needlepoint, read      Observation/Other Assessments   Scoliosis yes    Focus on Therapeutic Outcomes (FOTO)  70% goal 77%      Functional Tests   Functional tests Squat;Single leg stance      Squat   Comments WFL      Single Leg Stance   Comments unable to balance in SLS      Posture/Postural Control   Posture/Postural Control Postural limitations    Postural Limitations Increased thoracic kyphosis;Rounded Shoulders      ROM / Strength   AROM / PROM / Strength AROM;PROM;Strength      AROM   Overall AROM Comments thoracic spine limited 50-75% rotation, trunk flexion WFL without pain, bil SB limited 50% with pain      PROM   Overall PROM Comments Rt hip WFL    PROM Assessment Site Hip    Right/Left Hip Left    Left Hip Flexion 100   pain   Left Hip External Rotation  60   pain     Strength   Overall Strength Comments bil hips 4-/5, knees 4/5      Flexibility   Soft Tissue Assessment /Muscle Length yes    Hamstrings limited end range    Quadriceps limited 30 deg bil    Piriformis limited on Lt 50% with pain      Palpation   Spinal mobility limited thoracic mobility throughout, pain with light PAs lumbar spine    SI assessment  tenderness along Lt SI joint line, atrophy of glute  max and multifidus present  Palpation comment Lt: SI joint line, glute med, piriformis, lumbar paraspinals, QL, ITB      Special Tests    Special Tests Hip Special Tests    Hip Special Tests  Saralyn Pilar (FABER) Test      Saralyn Pilar [facility]) Test   Findings Positive    Side Left      Transfers   Transfers Independent with all Transfers                        Objective measurements completed on examination: See above findings.       [facility] PT Treatment/Exercise - 06/20/21 0001       Exercises   Exercises Other Exercises    Other Exercises  discussion and review of Pt's current exercises from videos, [employer] HEP initiated with some overlap                       PT Short Term Goals - 06/20/21 1152       PT SHORT TERM GOAL #1   Title Pt ind with initial HEP without exacerbation of symptoms    Time 2    Period Weeks    Status New    Target Date 07/04/21               PT Long Term Goals - 06/20/21 1152       PT LONG TERM GOAL #1   Title Pt will be able to perform household and yard tasks without sharp pains at least 70% of the time.    Time 8    Period Weeks    Status New    Target Date 08/15/21      PT LONG TERM GOAL #2   Title Pt will be able to demo functional strength for step ups, weighted squats, and lifting with good body mechanics and core use without pain for imrpoved functional task performance.    Time 8    Period Weeks    Status New    Target Date 08/15/21      PT LONG TERM GOAL #3   Title Pt will improve FOTO score to at least 77% to demo improved function.    Baseline 70%    Time 8    Period Weeks    Status New    Target Date 08/15/21      PT LONG TERM GOAL #4   Title Pt will be ind with advanced HEP and understand how to progress safely.    Time 8    Period Weeks    Status New    Target Date 08/15/21      PT LONG TERM GOAL #5   Title Pt will achieve Lt hip ROM to symmetrical with Rt without pain for  improved functional use of bil LEs    Time 8    Period Weeks    Status New    Target Date 08/15/21                    Plan - 06/20/21 1134     Clinical Impression Statement Pt is a pleasant 80yo with onset of Lt LBP and buttock pain approx 6 weeks ago which is described as sharp and intermittent with bending and lifting, especially flexion with Rt SB combined.  Pt is able to either avoid this or reposition posture to eliminate pain to achieve 0/10 pain.  Pain is 10/10 when it occurs.  Pt  has history of chronic bil hip pain (bursitis, ITB syndrome, injections), scoliosis and osteoporosis.  She walks for exercise and does 30 min exercise videos at home.  Pt is ind with all transfers.  She presents with limited spinal mobility, poor balance in SLS, weakness 4-/5 in bil hips and bil knees 4/5 with Lt hip pain with resistance testing of hip ER, abd, flexion.  She has limited Lt hip ROM into ER and flexion.  Lt SI joint line, glut med and piriformis appear to be central sources of tenderness with more mild tenderness in Lt lumbar paraspinals and QL.  Atrophy is present in Lt multifidi and glut max.  PT issued initial HEP after reviewing with Pt her current exercise regimen.  Pt has uncertain availability for future appointments given her husband's health.  She hopes to work on pain reduction and strengthening of her abdominals and back.  She is an excellent candidate for skilled PT to address examination findings.    Personal Factors and Comorbidities Age;Comorbidity 1;Comorbidity 2    Comorbidities osteoporosis, scoliosis, Hx of chronic bil hip pain    Examination-Activity Limitations Lift;Bend;Sit    Examination-Participation Restrictions Cleaning;Dorita Sciara    Stability/Clinical Decision Making Stable/Uncomplicated    Clinical Decision Making Low    Rehab Potential Good    PT Frequency 2x / week    PT Duration 8 weeks    PT Treatment/Interventions ADLs/Self Care Home  Management;Therapeutic exercise;Functional mobility training;Dry needling;Passive range of motion;Neuromuscular re-education;Manual techniques;Patient/family education;Electrical Stimulation;Cryotherapy;Moist Heat;Iontophoresis 4mg /ml Dexamethasone    PT Next Visit Plan f/u on HEP from eval, DN Lt glut med, piriformis, lumbar, QL, progress HEP to include scapular strength, hip, core and back strength - Pt may have limited ability to attend sessions once her husband comes home from rehab so needs HEP front loaded in next 2 sessions    PT Home Exercise Plan Access Code: [email or web address]    Consulted and Agree with Plan of Care Patient             Patient will benefit from skilled therapeutic intervention in order to improve the following deficits and impairments:  Decreased range of motion, Increased muscle spasms, Pain, Impaired flexibility, Decreased strength, Decreased mobility, Postural dysfunction, Decreased balance, Hypomobility  Visit Diagnosis: Left-sided low back pain without sciatica, unspecified chronicity - Plan: PT plan of care cert/re-cert  Pain in left hip - Plan: PT plan of care cert/re-cert  Abnormal posture - Plan: PT plan of care cert/re-cert  Muscle weakness (generalized) - Plan: PT plan of care cert/re-cert     Problem List Patient Active Problem List   Diagnosis Date Noted   Pain of left hip joint 04/05/2020   Throat discomfort 02/29/2020   Eustachian tube dysfunction, left 01/29/2020   Impacted cerumen of right ear 01/29/2020   Arthritis 10/26/2018   Irritable bowel syndrome 10/26/2018   Osteoporosis 10/26/2018   Urinary tract infectious disease 10/26/2018   Carpal tunnel syndrome on left 04/07/2016   Pain in finger of right hand 04/07/2016   Pain in soft tissues of limb 02/29/2016   Primary localized osteoarthrosis, 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, [place], [place], [address] Phone: [phone]   Fax:  [phone]  Name: Alicia Bradley MRN: [id number] Date of Birth: April 30, 1941

## 2021-06-23 ENCOUNTER — Ambulatory Visit: Payer: Medicare HMO | Admitting: Physical Therapy

## 2021-06-24 ENCOUNTER — Encounter: Payer: Self-pay | Admitting: Physical Therapy

## 2021-06-24 ENCOUNTER — Other Ambulatory Visit: Payer: Self-pay

## 2021-06-24 ENCOUNTER — Ambulatory Visit: Payer: Medicare HMO | Admitting: Physical Therapy

## 2021-06-24 DIAGNOSIS — M6281 Muscle weakness (generalized): Secondary | ICD-10-CM

## 2021-06-24 DIAGNOSIS — R293 Abnormal posture: Secondary | ICD-10-CM | POA: Diagnosis not present

## 2021-06-24 DIAGNOSIS — M545 Low back pain, unspecified: Secondary | ICD-10-CM | POA: Diagnosis not present

## 2021-06-24 DIAGNOSIS — M25552 Pain in left hip: Secondary | ICD-10-CM | POA: Diagnosis not present

## 2021-06-24 NOTE — Therapy (Signed)
[facility] [address] [place], [place], [address] Phone: [phone]   Fax:  [phone]  Physical Therapy Treatment  Patient Details  Name: Alicia Bradley MRN: [id number] Date of Birth: 1941-02-25 Referring Provider (PT): Derl Barrow, [place]   Encounter Date: 06/24/2021   PT End of Session - 06/24/21 0802     Visit Number 2    Date for PT Re-Evaluation 08/15/21    Authorization Type Aetna    PT Start Time 0802    PT Stop Time 0840    PT Time Calculation (min) 38 min    Activity Tolerance Patient tolerated treatment well    Behavior During Therapy [facility] for tasks assessed/performed             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   GERD (gastroesophageal reflux disease)    Irritable bowel syndrome    Nocturia    OA (osteoarthritis)    Osteoporosis    Presence of pessary    mangaged by gyn-- dr Radene Knee   Presence of surgical incision    03-28-2021   per pt had benign cyst removed from right arm above wrist 03-10-2021 still healing   Urgency incontinence    urologist-- dr winter    Past Surgical History:  Procedure Laterality Date   BLADDER SUSPENSION  2001   transvaginal sling and cystocele repair   BLEPHAROPLASTY W/ LASER Bilateral 12/2013   upper eyelids   BOTOX INJECTION N/A 04/02/2021   Procedure: BOTOX INJECTION WITH CYSTOSCOPY, 100 UNITS;  Surgeon: Ceasar Mons, MD;  Location: [facility];  Service: Urology;  Laterality: N/A;  ONLY NEEDS 30 MIN   CARPAL TUNNEL RELEASE Right 04/2009   AND RIGHT CUBITAL TUNNEL RELEASE   CATARACT EXTRACTION W/ INTRAOCULAR LENS IMPLANT Bilateral 2006   CESAREAN SECTION     x 2  last one 1972   COLONOSCOPY     last one 2010 approx.   CYSTOCELE REPAIR  2005   and vaginal correction   DILATION AND CURETTAGE OF UTERUS  1967   EXCISION/RELEASE  BURSA HIP  11/25/2011   Procedure: EXCISION/RELEASE BURSA HIP;  Surgeon: Gearlean Alf, MD;  Location: [facility];  Service: Orthopedics;  Laterality: Right;  Right Hip Bursectomy with Possible Tendon Repair   FINGER ARTHRODESIS Right 04/2016   right index finger joint   TONSILLECTOMY     child    There were no vitals filed for this visit.   Subjective Assessment - 06/24/21 0803     Subjective i have a lot of discomfort in my lower back but I'm fine now. just stiffness in low back.    Patient Stated Goals strengthen my stomach muscles and get rid of Lt sided pain    Currently in Pain? No/denies                               [facility] Adult PT Treatment/Exercise - 06/24/21 0001       Exercises   Exercises Knee/Hip      Knee/Hip Exercises: Stretches   Quad Stretch Both;1 rep;20 seconds    Quad Stretch Limitations SDLY    Piriformis Stretch Both;2 reps;20 seconds      Knee/Hip Exercises: Supine   Bridges 10 reps    Bridges Limitations cues to  slow down and hold longer 2-3 sec    Straight Leg Raises Both;10 reps      Knee/Hip Exercises: Sidelying   Hip ABduction 10 reps    Hip ABduction Limitations yellow loop on R; nothing on L    Clams yellow loop x 10 B      Knee/Hip Exercises: Prone   Straight Leg Raises Both;10 reps    Straight Leg Raises Limitations in quadriped on elbows      Manual Therapy   Manual Therapy Soft tissue mobilization    Manual therapy comments Skilled palpation and monitoring of soft tissues during DN    Soft tissue mobilization to left gluteals and piriformis post DN              Trigger Point Dry Needling - 06/24/21 0001     Consent Given? Yes    Education Handout Provided Previously provided    Muscles Treated Back/Hip Gluteus minimus;Gluteus medius;Piriformis    Dry Needling Comments left    Gluteus Minimus Response Twitch response elicited;Palpable increased muscle length    Gluteus Medius Response Palpable increased  muscle length    Piriformis Response Palpable increased muscle length                     PT Short Term Goals - 06/20/21 1152       PT SHORT TERM GOAL #1   Title Pt ind with initial HEP without exacerbation of symptoms    Time 2    Period Weeks    Status New    Target Date 07/04/21               PT Long Term Goals - 06/20/21 1152       PT LONG TERM GOAL #1   Title Pt will be able to perform household and yard tasks without sharp pains at least 70% of the time.    Time 8    Period Weeks    Status New    Target Date 08/15/21      PT LONG TERM GOAL #2   Title Pt will be able to demo functional strength for step ups, weighted squats, and lifting with good body mechanics and core use without pain for imrpoved functional task performance.    Time 8    Period Weeks    Status New    Target Date 08/15/21      PT LONG TERM GOAL #3   Title Pt will improve FOTO score to at least 77% to demo improved function.    Baseline 70%    Time 8    Period Weeks    Status New    Target Date 08/15/21      PT LONG TERM GOAL #4   Title Pt will be ind with advanced HEP and understand how to progress safely.    Time 8    Period Weeks    Status New    Target Date 08/15/21      PT LONG TERM GOAL #5   Title Pt will achieve Lt hip ROM to symmetrical with Rt without pain for improved functional use of bil LEs    Time 8    Period Weeks    Status New    Target Date 08/15/21                   Plan - 06/24/21 0842     Clinical Impression Statement Pt reporting just stiffness this morning. HEP reviewed  with minimal cues required. Cues given to slow down and hold longer on some exercises. Initial trial of DN to left gluteals and pirifomis with good response of tissues. No goals met as this was first f/u visit after eval.    PT Frequency 2x / week    PT Duration 8 weeks    PT Treatment/Interventions ADLs/Self Care Home Management;Therapeutic exercise;Functional mobility  training;Dry needling;Passive range of motion;Neuromuscular re-education;Manual techniques;Patient/family education;Electrical Stimulation;Cryotherapy;Moist Heat;Iontophoresis 58m/ml Dexamethasone    PT Next Visit Plan f/u on DN Lt glut med, piriformis. Continue as indicated to lumbar, QL (pt would like to wait until next week), progress HEP to include scapular strength, hip, core and back strength - Pt may have limited ability to attend sessions once her husband comes home from rehab so needs HEP front loaded in next 2 sessions    PT Home Exercise Plan Access Code: [email or web address]    Consulted and Agree with Plan of Care Patient             Patient will benefit from skilled therapeutic intervention in order to improve the following deficits and impairments:  Decreased range of motion, Increased muscle spasms, Pain, Impaired flexibility, Decreased strength, Decreased mobility, Postural dysfunction, Decreased balance, Hypomobility  Visit Diagnosis: Left-sided low back pain without sciatica, unspecified chronicity  Pain in left hip  Abnormal posture  Muscle weakness (generalized)     Problem List Patient Active Problem List   Diagnosis Date Noted   Pain of left hip joint 04/05/2020   Throat discomfort 02/29/2020   Eustachian tube dysfunction, left 01/29/2020   Impacted cerumen of right ear 01/29/2020   Arthritis 10/26/2018   Irritable bowel syndrome 10/26/2018   Osteoporosis 10/26/2018   Urinary tract infectious disease 10/26/2018   Carpal tunnel syndrome on left 04/07/2016   Pain in finger of right hand 04/07/2016   Pain in soft tissues of limb 02/29/2016   Primary localized osteoarthrosis, hand 02/25/2016   Dermatochalasis of both upper eyelids 01/12/2014   Peripheral visual field defect 01/12/2014   Droopy eyelid 11/27/2013   Dry eye 11/27/2013   Glaucoma suspect of both eyes 11/27/2013   Other specified hypertrophic and atrophic condition of skin 11/27/2013   VFD (visual  field defect) 11/27/2013   Trochanteric bursitis 11/25/2011   Congenital pancreatic cyst 08/11/2011    Ceasar Decandia, PT 06/24/2021, 8:54 AM  [facility] [address] [place] [address]: [phone]  Fax:  [phone] Name: Alicia ALPERTMRN: [id number] of Birth: [birth year]

## 2021-06-25 ENCOUNTER — Ambulatory Visit: Payer: Medicare HMO | Admitting: Physical Therapy

## 2021-06-25 ENCOUNTER — Encounter: Payer: Self-pay | Admitting: Physical Therapy

## 2021-06-25 DIAGNOSIS — R293 Abnormal posture: Secondary | ICD-10-CM | POA: Diagnosis not present

## 2021-06-25 DIAGNOSIS — M25552 Pain in left hip: Secondary | ICD-10-CM | POA: Diagnosis not present

## 2021-06-25 DIAGNOSIS — M6281 Muscle weakness (generalized): Secondary | ICD-10-CM | POA: Diagnosis not present

## 2021-06-25 DIAGNOSIS — M545 Low back pain, unspecified: Secondary | ICD-10-CM | POA: Diagnosis not present

## 2021-06-25 NOTE — Therapy (Signed)
[facility] [address], [place], [place], [address] Phone: [phone]   Fax:  [phone]  Physical Therapy Treatment  Patient Details  Name: Alicia Bradley MRN: [id number] Date of Birth: 08/05/1941 Referring Provider (PT): Derl Barrow, [place]   Encounter Date: 06/25/2021   PT End of Session - 06/25/21 0843     Visit Number 3    Date for PT Re-Evaluation 08/15/21    Authorization Type Aetna    PT Start Time 0805    PT Stop Time 0843    PT Time Calculation (min) 38 min    Activity Tolerance Patient tolerated treatment well;No increased pain    Behavior During Therapy [facility] for tasks assessed/performed             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   GERD (gastroesophageal reflux disease)    Irritable bowel syndrome    Nocturia    OA (osteoarthritis)    Osteoporosis    Presence of pessary    mangaged by gyn-- dr Radene Knee   Presence of surgical incision    03-28-2021   per pt had benign cyst removed from right arm above wrist 03-10-2021 still healing   Urgency incontinence    urologist-- dr winter    Past Surgical History:  Procedure Laterality Date   BLADDER SUSPENSION  2001   transvaginal sling and cystocele repair   BLEPHAROPLASTY W/ LASER Bilateral 12/2013   upper eyelids   BOTOX INJECTION N/A 04/02/2021   Procedure: BOTOX INJECTION WITH CYSTOSCOPY, 100 UNITS;  Surgeon: Ceasar Mons, MD;  Location: [facility];  Service: Urology;  Laterality: N/A;  ONLY NEEDS 30 MIN   CARPAL TUNNEL RELEASE Right 04/2009   AND RIGHT CUBITAL TUNNEL RELEASE   CATARACT EXTRACTION W/ INTRAOCULAR LENS IMPLANT Bilateral 2006   CESAREAN SECTION     x 2  last one 1972   COLONOSCOPY     last one 2010 approx.   CYSTOCELE REPAIR  2005   and vaginal correction   DILATION AND CURETTAGE OF UTERUS  1967    EXCISION/RELEASE BURSA HIP  11/25/2011   Procedure: EXCISION/RELEASE BURSA HIP;  Surgeon: Gearlean Alf, MD;  Location: [facility];  Service: Orthopedics;  Laterality: Right;  Right Hip Bursectomy with Possible Tendon Repair   FINGER ARTHRODESIS Right 04/2016   right index finger joint   TONSILLECTOMY     child    There were no vitals filed for this visit.   Subjective Assessment - 06/25/21 0846     Subjective Pt states she is sore in her Lt buttock area after the dry needling, but her back isn't bothering her right this second. The heated seats make her buttock region feel better. HEP is going well.    Patient Stated Goals strengthen my stomach muscles and get rid of Lt sided pain    Currently in Pain? No/denies                               [facility] Adult PT Treatment/Exercise - 06/25/21 0001       Knee/Hip Exercises: Stretches   Piriformis Stretch Both;2 reps;20 seconds    Piriformis Stretch Limitations supine knee to opposite chest      Knee/Hip Exercises: Standing   Other Standing Knee Exercises BUE  pressdown with red TB x10 reps, green TB x10 reps      Knee/Hip Exercises: Supine   Heel Slides Both;AROM;1 set;5 reps    Heel Slides Limitations abdominal brace    Other Supine Knee/Hip Exercises abdominal brace with hip flexion isometric x10 reps each side    Other Supine Knee/Hip Exercises hip 90/90 alternating toe tap to 6" box 2x5 reps- PT cuing to increase deep abdominal activation      Manual Therapy   Soft tissue mobilization STM Lt glutes, promimal and lateral portion primarily                       PT Short Term Goals - 06/20/21 1152       PT SHORT TERM GOAL #1   Title Pt ind with initial HEP without exacerbation of symptoms    Time 2    Period Weeks    Status New    Target Date 07/04/21               PT Long Term Goals - 06/20/21 1152       PT LONG TERM GOAL #1   Title Pt will be able to perform household and yard  tasks without sharp pains at least 70% of the time.    Time 8    Period Weeks    Status New    Target Date 08/15/21      PT LONG TERM GOAL #2   Title Pt will be able to demo functional strength for step ups, weighted squats, and lifting with good body mechanics and core use without pain for imrpoved functional task performance.    Time 8    Period Weeks    Status New    Target Date 08/15/21      PT LONG TERM GOAL #3   Title Pt will improve FOTO score to at least 77% to demo improved function.    Baseline 70%    Time 8    Period Weeks    Status New    Target Date 08/15/21      PT LONG TERM GOAL #4   Title Pt will be ind with advanced HEP and understand how to progress safely.    Time 8    Period Weeks    Status New    Target Date 08/15/21      PT LONG TERM GOAL #5   Title Pt will achieve Lt hip ROM to symmetrical with Rt without pain for improved functional use of bil LEs    Time 8    Period Weeks    Status New    Target Date 08/15/21                   Plan - 06/25/21 0842     Clinical Impression Statement Pt had some expected soreness in the Lt buttock region following her dry needling treatment yesterday. She is diligently working on her HEP without any concerns, so PT focused on some new exercises. Pt was able to demonstrate proper transverse abdominus activation with hip flexion isometric, but she had trouble with maintaining activation during higher level exercises. PT added bilateral UE press down to pt's HEP and she felt good trunk muscle activation without increase in pain. Ended session addressing gluteal soft tissue restrictions.    PT Frequency 2x / week    PT Duration 8 weeks    PT Treatment/Interventions ADLs/Self Care Home Management;Therapeutic exercise;Functional mobility training;Dry needling;Passive range  of motion;Neuromuscular re-education;Manual techniques;Patient/family education;Electrical Stimulation;Cryotherapy;Moist Heat;Iontophoresis 4mg /ml  Dexamethasone    PT Next Visit Plan f/u on DN Lt glut med, piriformis Lx, QL as needed, progress HEP and therex to include scapular strength, hip, core and back strength;    PT Home Exercise Plan Access Code: [email or web address]    Consulted and Agree with Plan of Care Patient             Patient will benefit from skilled therapeutic intervention in order to improve the following deficits and impairments:  Decreased range of motion, Increased muscle spasms, Pain, Impaired flexibility, Decreased strength, Decreased mobility, Postural dysfunction, Decreased balance, Hypomobility  Visit Diagnosis: Left-sided low back pain without sciatica, unspecified chronicity  Pain in left hip  Muscle weakness (generalized)  Abnormal posture     Problem List Patient Active Problem List   Diagnosis Date Noted   Pain of left hip joint 04/05/2020   Throat discomfort 02/29/2020   Eustachian tube dysfunction, left 01/29/2020   Impacted cerumen of right ear 01/29/2020   Arthritis 10/26/2018   Irritable bowel syndrome 10/26/2018   Osteoporosis 10/26/2018   Urinary tract infectious disease 10/26/2018   Carpal tunnel syndrome on left 04/07/2016   Pain in finger of right hand 04/07/2016   Pain in soft tissues of limb 02/29/2016   Primary localized osteoarthrosis, hand 02/25/2016   Dermatochalasis of both upper eyelids 01/12/2014   Peripheral visual field defect 01/12/2014   Droopy eyelid 11/27/2013   Dry eye 11/27/2013   Glaucoma suspect of both eyes 11/27/2013   Other specified hypertrophic and atrophic condition of skin 11/27/2013   VFD (visual field defect) 11/27/2013   Trochanteric bursitis 11/25/2011   Congenital pancreatic cyst 08/11/2011   8:47 AM,06/25/21 Sherol Dade PT, DPT [facility] [address], [place], [place], [address] Phone: [phone]   Fax:   [phone]  Name: Alicia Bradley MRN: [id number] Date of Birth: [birth year]

## 2021-06-25 NOTE — Patient Instructions (Signed)
Access Code: [email or web address] URL: [email or web address] Date: 06/25/2021 Prepared by: [place] on Forearms Hip Extension - 1 x daily - 7 x weekly - 1 sets - 10 reps Supine Bridge - 1 x daily - 7 x weekly - 2 sets - 10 reps Supine Pelvic Tilt with Straight Leg Raise - 1 x daily - 7 x weekly - 1 sets - 10 reps Supine Piriformis Stretch with Foot on Ground - 1 x daily - 7 x weekly - 1 sets - 2 reps - 20 hold Supine Lower Trunk Rotation - 1 x daily - 7 x weekly - 1 sets - 10 reps Clamshell - 1 x daily - 7 x weekly - 1 sets - 10 reps Sidelying Hip Abduction - 1 x daily - 7 x weekly - 1 sets - 10 reps Sidelying Quadriceps Stretch - 1 x daily - 7 x weekly - 1 sets - 2 reps - 20 hold Standing Shoulder Extension with Resistance - 1 x daily - 7 x weekly - 2 sets - 10 reps   [facility] [address], [place], [place] [address] Phone # [phone] Fax [phone]

## 2021-07-02 ENCOUNTER — Other Ambulatory Visit: Payer: Self-pay

## 2021-07-02 ENCOUNTER — Ambulatory Visit: Payer: Medicare HMO | Attending: Student | Admitting: Physical Therapy

## 2021-07-02 ENCOUNTER — Encounter: Payer: Self-pay | Admitting: Physical Therapy

## 2021-07-02 DIAGNOSIS — M25552 Pain in left hip: Secondary | ICD-10-CM | POA: Diagnosis not present

## 2021-07-02 DIAGNOSIS — M6281 Muscle weakness (generalized): Secondary | ICD-10-CM | POA: Insufficient documentation

## 2021-07-02 DIAGNOSIS — M545 Low back pain, unspecified: Secondary | ICD-10-CM | POA: Insufficient documentation

## 2021-07-02 DIAGNOSIS — R293 Abnormal posture: Secondary | ICD-10-CM | POA: Insufficient documentation

## 2021-07-02 NOTE — Therapy (Signed)
[facility] [place], [place], [address] Phone:     Fax:     Physical Therapy Treatment  Patient Details  Name: Alicia Bradley MRN: [id number] Date of Birth: [birth year] Referring Provider (PT): Derl Barrow, [place]   Encounter Date: 07/02/2021   PT End of Session - 07/02/21 0855     Visit Number 4    Date for PT Re-Evaluation 08/15/21    Authorization Type Aetna    PT Start Time [phone]    PT Stop Time 0935    PT Time Calculation (min) 42 min    Activity Tolerance Patient tolerated treatment well;No increased pain    Behavior During Therapy [facility] for tasks assessed/performed             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   GERD (gastroesophageal reflux disease)    Irritable bowel syndrome    Nocturia    OA (osteoarthritis)    Osteoporosis    Presence of pessary    mangaged by gyn-- dr Radene Knee   Presence of surgical incision    03-28-2021   per pt had benign cyst removed from right arm above wrist 03-10-2021 still healing   Urgency incontinence    urologist-- dr winter    Past Surgical History:  Procedure Laterality Date   BLADDER SUSPENSION  2001   transvaginal sling and cystocele repair   BLEPHAROPLASTY W/ LASER Bilateral 12/2013   upper eyelids   BOTOX INJECTION N/A 04/02/2021   Procedure: BOTOX INJECTION WITH CYSTOSCOPY, 100 UNITS;  Surgeon: Ceasar Mons, MD;  Location: [facility];  Service: Urology;  Laterality: N/A;  ONLY NEEDS 30 MIN   CARPAL TUNNEL RELEASE Right 04/2009   AND RIGHT CUBITAL TUNNEL RELEASE   CATARACT EXTRACTION W/ INTRAOCULAR LENS IMPLANT Bilateral 2006   CESAREAN SECTION     x 2  last one 1972   COLONOSCOPY     last one 2010 approx.   CYSTOCELE REPAIR  2005   and vaginal correction   DILATION AND CURETTAGE OF UTERUS  1967   EXCISION/RELEASE BURSA HIP   11/25/2011   Procedure: EXCISION/RELEASE BURSA HIP;  Surgeon: Gearlean Alf, MD;  Location: [facility];  Service: Orthopedics;  Laterality: Right;  Right Hip Bursectomy with Possible Tendon Repair   FINGER ARTHRODESIS Right 04/2016   right index finger joint   TONSILLECTOMY     child    There were no vitals filed for this visit.   Subjective Assessment - 07/02/21 0856     Subjective I hated the DN. I was sore for 4-5 days. Overall making improvements. Hardly ever any sharp pains.    Pertinent History PMH: chronic bil hip pain, osteoporosis, scoliosis, c-sections x 2, cystocele repair    Patient Stated Goals strengthen my stomach muscles and get rid of Lt sided pain    Currently in Pain? No/denies                               [facility] Adult PT Treatment/Exercise - 07/02/21 0001       Self-Care   Self-Care Other Self-Care Comments    Other Self-Care Comments  MFR with ball to bil gluteus medius; also discussed doing strengthening exercises first and ending with stretching and MFR  Knee/Hip Exercises: Stretches   ITB Stretch Both;1 rep;20 seconds    ITB Stretch Limitations sidelying of EOB      Knee/Hip Exercises: Machines for Strengthening   Hip Cybex 40# extension x 10 bil   increased wt next visit     Knee/Hip Exercises: Standing   Hip ADduction --    Hip ADduction Limitations --    Hip Abduction Both;10 reps    Abduction Limitations as alternative to SDLY   less painful   Functional Squat 20 reps;10 reps    Functional Squat Limitations 1 set no wt; 1 set 5#KB, 1 set 10#KB    SLS multiple reps bil; use of one UE support needed for Rt LE    SLS with Vectors did on Lt LE; difficult    Other Standing Knee Exercises dead lift x 10 no wt; 5#KB x 5; 10#KB x 5 started to feel in her back; form good                     PT Education - 07/02/21 1326     Education Details HEP progressed; MFR with ball    Person(s) Educated Patient    Methods  Explanation;Demonstration;Tactile cues;Verbal cues;Handout    Comprehension Verbalized understanding              PT Short Term Goals - 07/02/21 0858       PT SHORT TERM GOAL #1   Title Pt ind with initial HEP without exacerbation of symptoms    Baseline SDLY hip ABD painful    Status Partially Met               PT Long Term Goals - 07/02/21 0858       PT LONG TERM GOAL #1   Title Pt will be able to perform household and yard tasks without sharp pains at least 70% of the time.    Status Achieved                   Plan - 07/02/21 0943     Clinical Impression Statement Pt reporting improvement overall but still experiencing sx. LTG #1 is met. She reports pain with SDLY hip ABD so we addressed glute med tightness with self MFR using ball. Pt with good form with functional squats with KB and with deadlifts, however she did feel some lower back discomfort with deadlifts. She had difficulty with single leg reach to table due to balance, so we worked on [employer] today as well. She will benefit from more balance training and glut med strengthening bil.    PT Frequency 2x / week    PT Duration 8 weeks    PT Treatment/Interventions ADLs/Self Care Home Management;Therapeutic exercise;Functional mobility training;Dry needling;Passive range of motion;Neuromuscular re-education;Manual techniques;Patient/family education;Electrical Stimulation;Cryotherapy;Moist Heat;Iontophoresis 93m/ml Dexamethasone    PT Next Visit Plan progress HEP and therex to include scapular strength, hip, core and back strength and balance.    PT Home Exercise Plan Access Code: [email or web address]    Consulted and Agree with Plan of Care Patient             Patient will benefit from skilled therapeutic intervention in order to improve the following deficits and impairments:  Decreased range of motion, Increased muscle spasms, Pain, Impaired flexibility, Decreased strength, Decreased mobility, Postural dysfunction,  Decreased balance, Hypomobility  Visit Diagnosis: Left-sided low back pain without sciatica, unspecified chronicity  Pain in left hip  Muscle weakness (generalized)  Abnormal posture  Problem List Patient Active Problem List   Diagnosis Date Noted   Pain of left hip joint 04/05/2020   Throat discomfort 02/29/2020   Eustachian tube dysfunction, left 01/29/2020   Impacted cerumen of right ear 01/29/2020   Arthritis 10/26/2018   Irritable bowel syndrome 10/26/2018   Osteoporosis 10/26/2018   Urinary tract infectious disease 10/26/2018   Carpal tunnel syndrome on left 04/07/2016   Pain in finger of right hand 04/07/2016   Pain in soft tissues of limb 02/29/2016   Primary localized osteoarthrosis, hand 02/25/2016   Dermatochalasis of both upper eyelids 01/12/2014   Peripheral visual field defect 01/12/2014   Droopy eyelid 11/27/2013   Dry eye 11/27/2013   Glaucoma suspect of both eyes 11/27/2013   Other specified hypertrophic and atrophic condition of skin 11/27/2013   VFD (visual field defect) 11/27/2013   Trochanteric bursitis 11/25/2011   Congenital pancreatic cyst 08/11/2011    Madelyn Flavors, PT 07/02/2021, 1:36 PM  [facility] @ [place], [place], [address] Phone:     Fax:     Name: Alicia Bradley MRN: [id number] Date of Birth: Dec 08, 1940

## 2021-07-02 NOTE — Patient Instructions (Signed)
Access Code: [email or web address] URL: [email or web address] Date: 07/02/2021 Prepared by: Almyra Free  Exercises Quadruped on Forearms Hip Extension - 1 x daily - 7 x weekly - 1 sets - 10 reps Supine Bridge - 1 x daily - 7 x weekly - 2 sets - 10 reps Supine Pelvic Tilt with Straight Leg Raise - 1 x daily - 7 x weekly - 1 sets - 10 reps Supine Piriformis Stretch with Foot on Ground - 1 x daily - 7 x weekly - 1 sets - 2 reps - 20 hold Supine Lower Trunk Rotation - 1 x daily - 7 x weekly - 1 sets - 10 reps Clamshell - 1 x daily - 7 x weekly - 1 sets - 10 reps Sidelying Hip Abduction - 1 x daily - 7 x weekly - 1 sets - 10 reps Sidelying Quadriceps Stretch - 1 x daily - 7 x weekly - 1 sets - 2 reps - 20 hold Standing Shoulder Extension with Resistance - 1 x daily - 7 x weekly - 2 sets - 10 reps Standing Single Leg Stance with Counter Support - 2 x daily - 7 x weekly - 5 reps - 1 sets - max hold Squat with Chair Touch - 1 x daily - 7 x weekly - 3 sets - 10 reps Standing Glute Med Mobilization with Small Ball on Wall - 1 x daily - 7 x weekly - 3 sets - 10 reps

## 2021-07-03 ENCOUNTER — Encounter: Payer: Self-pay | Admitting: Physical Therapy

## 2021-07-03 ENCOUNTER — Ambulatory Visit: Payer: Medicare HMO | Admitting: Physical Therapy

## 2021-07-03 DIAGNOSIS — M545 Low back pain, unspecified: Secondary | ICD-10-CM

## 2021-07-03 DIAGNOSIS — R293 Abnormal posture: Secondary | ICD-10-CM | POA: Diagnosis not present

## 2021-07-03 DIAGNOSIS — M6281 Muscle weakness (generalized): Secondary | ICD-10-CM | POA: Diagnosis not present

## 2021-07-03 DIAGNOSIS — M25552 Pain in left hip: Secondary | ICD-10-CM

## 2021-07-03 NOTE — Patient Instructions (Signed)
Access Code: [email or web address] URL: [email or web address] Date: 07/03/2021 Prepared by: Almyra Free  Exercises Quadruped on Forearms Hip Extension - 1 x daily - 7 x weekly - 1 sets - 10 reps Supine Bridge - 1 x daily - 7 x weekly - 2 sets - 10 reps Supine Pelvic Tilt with Straight Leg Raise - 1 x daily - 7 x weekly - 1 sets - 10 reps Supine Piriformis Stretch with Foot on Ground - 1 x daily - 7 x weekly - 1 sets - 2 reps - 20 hold Supine Lower Trunk Rotation - 1 x daily - 7 x weekly - 1 sets - 10 reps Clamshell - 1 x daily - 7 x weekly - 1 sets - 10 reps Sidelying Hip Abduction - 1 x daily - 7 x weekly - 1 sets - 10 reps Sidelying Quadriceps Stretch - 1 x daily - 7 x weekly - 1 sets - 2 reps - 20 hold Standing Shoulder Extension with Resistance - 1 x daily - 7 x weekly - 2 sets - 10 reps Standing Single Leg Stance with Counter Support - 2 x daily - 7 x weekly - 5 reps - 1 sets - max hold Squat with Chair Touch - 1 x daily - 7 x weekly - 3 sets - 10 reps Standing Glute Med Mobilization with Small Ball on Wall - 1 x daily - 7 x weekly - 3 sets - 10 reps Squatting Shoulder Row with Anchored Resistance - 1 x daily - 7 x weekly - 3 sets - 10 reps Standing Shoulder Horizontal Abduction with Resistance - 1 x daily - 7 x weekly - 3 sets - 10 reps Beginner Prone Single Leg Raise - 1 x daily - 7 x weekly - 3 sets - 10 reps

## 2021-07-03 NOTE — Therapy (Signed)
[facility] @ [place], [place], [address] Phone:     Fax:     Physical Therapy Treatment  Patient Details  Name: Alicia Bradley MRN: [id number] Date of Birth: 06/05/1941 Referring Provider (PT): Derl Barrow, [place]   Encounter Date: 07/03/2021   PT End of Session - 07/03/21 6811     Visit Number 5    Date for PT Re-Evaluation 08/15/21    Authorization Type Aetna    PT Start Time 0800    PT Stop Time 5726    PT Time Calculation (min) 47 min    Activity Tolerance Patient tolerated treatment well;No increased pain    Behavior During Therapy [facility] for tasks assessed/performed             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   GERD (gastroesophageal reflux disease)    Irritable bowel syndrome    Nocturia    OA (osteoarthritis)    Osteoporosis    Presence of pessary    mangaged by gyn-- dr Radene Knee   Presence of surgical incision    03-28-2021   per pt had benign cyst removed from right arm above wrist 03-10-2021 still healing   Urgency incontinence    urologist-- dr winter    Past Surgical History:  Procedure Laterality Date   BLADDER SUSPENSION  2001   transvaginal sling and cystocele repair   BLEPHAROPLASTY W/ LASER Bilateral 12/2013   upper eyelids   BOTOX INJECTION N/A 04/02/2021   Procedure: BOTOX INJECTION WITH CYSTOSCOPY, 100 UNITS;  Surgeon: Ceasar Mons, MD;  Location: [facility];  Service: Urology;  Laterality: N/A;  ONLY NEEDS 30 MIN   CARPAL TUNNEL RELEASE Right 04/2009   AND RIGHT CUBITAL TUNNEL RELEASE   CATARACT EXTRACTION W/ INTRAOCULAR LENS IMPLANT Bilateral 2006   CESAREAN SECTION     x 2  last one 1972   COLONOSCOPY     last one 2010 approx.   CYSTOCELE REPAIR  2005   and vaginal correction   DILATION AND CURETTAGE OF UTERUS  1967   EXCISION/RELEASE BURSA HIP   11/25/2011   Procedure: EXCISION/RELEASE BURSA HIP;  Surgeon: Gearlean Alf, MD;  Location: [facility];  Service: Orthopedics;  Laterality: Right;  Right Hip Bursectomy with Possible Tendon Repair   FINGER ARTHRODESIS Right 04/2016   right index finger joint   TONSILLECTOMY     child    There were no vitals filed for this visit.       [facility] PT Assessment - 07/03/21 0001       PROM   Left Hip Flexion 115   right 118   Left Hip External Rotation  62   right 61 deg   Left Hip Internal Rotation  38                           [facility] Adult PT Treatment/Exercise - 07/03/21 0001       Exercises   Exercises Shoulder      Knee/Hip Exercises: Stretches   Other Knee/Hip Stretches windshield wiper stretch x 20 (done in sitting and supine)      Knee/Hip Exercises: Prone   Straight Leg Raises Both;10 reps    Straight Leg Raises Limitations opp arm/leg causes pain in left hip  Other Prone Exercises AAROM for hip IR/ER in prone knee flex left hip x 10 for stretch      Shoulder Exercises: Supine   Horizontal ABduction Both;5 reps    Theraband Level (Shoulder Horizontal ABduction) Level 3 (Green)    External Rotation 5 reps    Theraband Level (Shoulder External Rotation) Level 3 (Green)    Flexion Right;Left;5 reps    Theraband Level (Shoulder Flexion) Level 3 (Green)      Shoulder Exercises: Standing   Extension 15 reps    Theraband Level (Shoulder Extension) Level 3 (Green)    Row 15 reps    Theraband Level (Shoulder Row) Level 3 (Green)    Other Standing Exercises horizontal ABD x 15 GTB                     PT Education - 07/03/21 1235     Education Details HEP progressed    Person(s) Educated Patient    Methods Explanation;Demonstration;Handout    Comprehension Verbalized understanding;Returned demonstration              PT Short Term Goals - 07/02/21 0858       PT SHORT TERM GOAL #1   Title Pt ind with initial HEP without exacerbation  of symptoms    Baseline SDLY hip ABD painful    Status Partially Met               PT Long Term Goals - 07/02/21 0858       PT LONG TERM GOAL #1   Title Pt will be able to perform household and yard tasks without sharp pains at least 70% of the time.    Status Achieved                   Plan - 07/03/21 0849     Clinical Impression Statement Pt showing improved ROM with both left hip flex and ER today. We worked on upper back strengthening and lumbar stab. She did well except with opp arm/leg in prone she had pain in left hip.    Comorbidities osteoporosis, scoliosis, Hx of chronic bil hip pain    PT Treatment/Interventions ADLs/Self Care Home Management;Therapeutic exercise;Functional mobility training;Dry needling;Passive range of motion;Neuromuscular re-education;Manual techniques;Patient/family education;Electrical Stimulation;Cryotherapy;Moist Heat;Iontophoresis 53m/ml Dexamethasone    PT Next Visit Plan progress HEP and therex to include scapular strength, hip, core and back strength and balance.    PT Home Exercise Plan Access Code: [email or web address]    Consulted and Agree with Plan of Care Patient             Patient will benefit from skilled therapeutic intervention in order to improve the following deficits and impairments:  Decreased range of motion, Increased muscle spasms, Pain, Impaired flexibility, Decreased strength, Decreased mobility, Postural dysfunction, Decreased balance, Hypomobility  Visit Diagnosis: Left-sided low back pain without sciatica, unspecified chronicity  Pain in left hip  Muscle weakness (generalized)  Abnormal posture     Problem List Patient Active Problem List   Diagnosis Date Noted   Pain of left hip joint 04/05/2020   Throat discomfort 02/29/2020   Eustachian tube dysfunction, left 01/29/2020   Impacted cerumen of right ear 01/29/2020   Arthritis 10/26/2018   Irritable bowel syndrome 10/26/2018   Osteoporosis 10/26/2018    Urinary tract infectious disease 10/26/2018   Carpal tunnel syndrome on left 04/07/2016   Pain in finger of right hand 04/07/2016   Pain in soft tissues of limb 02/29/2016  Primary localized osteoarthrosis, hand 02/25/2016   Dermatochalasis of both upper eyelids 01/12/2014   Peripheral visual field defect 01/12/2014   Droopy eyelid 11/27/2013   Dry eye 11/27/2013   Glaucoma suspect of both eyes 11/27/2013   Other specified hypertrophic and atrophic condition of skin 11/27/2013   VFD (visual field defect) 11/27/2013   Trochanteric bursitis 11/25/2011   Congenital pancreatic cyst 08/11/2011    Alicia Bradley, PT 07/03/2021, 12:39 PM  [facility] @ [place], [place], [address] Phone:     Fax:     Name: Alicia Bradley MRN: [id number] Date of Birth: 1940-10-22

## 2021-07-08 ENCOUNTER — Other Ambulatory Visit: Payer: Self-pay

## 2021-07-08 ENCOUNTER — Ambulatory Visit: Payer: Medicare HMO | Admitting: Physical Therapy

## 2021-07-08 ENCOUNTER — Encounter: Payer: Self-pay | Admitting: Physical Therapy

## 2021-07-08 DIAGNOSIS — M6281 Muscle weakness (generalized): Secondary | ICD-10-CM

## 2021-07-08 DIAGNOSIS — M25552 Pain in left hip: Secondary | ICD-10-CM

## 2021-07-08 DIAGNOSIS — R293 Abnormal posture: Secondary | ICD-10-CM

## 2021-07-08 DIAGNOSIS — M545 Low back pain, unspecified: Secondary | ICD-10-CM | POA: Diagnosis not present

## 2021-07-08 NOTE — Therapy (Signed)
[facility] @ [place], [place], [address] Phone: [phone]   Fax:  [phone]  Physical Therapy Treatment  Patient Details  Name: Alicia Bradley MRN: [id number] Date of Birth: 02-17-1941 Referring Provider (PT): Derl Barrow, [place]   Encounter Date: 07/08/2021   PT End of Session - 07/08/21 0934     Visit Number 6    Date for PT Re-Evaluation 08/15/21    Authorization Type Aetna    PT Start Time [phone]    PT Stop Time 1015    PT Time Calculation (min) 41 min    Activity Tolerance Patient tolerated treatment well;No increased pain    Behavior During Therapy [facility] for tasks assessed/performed             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   GERD (gastroesophageal reflux disease)    Irritable bowel syndrome    Nocturia    OA (osteoarthritis)    Osteoporosis    Presence of pessary    mangaged by gyn-- dr Radene Knee   Presence of surgical incision    03-28-2021   per pt had benign cyst removed from right arm above wrist 03-10-2021 still healing   Urgency incontinence    urologist-- dr winter    Past Surgical History:  Procedure Laterality Date   BLADDER SUSPENSION  2001   transvaginal sling and cystocele repair   BLEPHAROPLASTY W/ LASER Bilateral 12/2013   upper eyelids   BOTOX INJECTION N/A 04/02/2021   Procedure: BOTOX INJECTION WITH CYSTOSCOPY, 100 UNITS;  Surgeon: Ceasar Mons, MD;  Location: [facility];  Service: Urology;  Laterality: N/A;  ONLY NEEDS 30 MIN   CARPAL TUNNEL RELEASE Right 04/2009   AND RIGHT CUBITAL TUNNEL RELEASE   CATARACT EXTRACTION W/ INTRAOCULAR LENS IMPLANT Bilateral 2006   CESAREAN SECTION     x 2  last one 1972   COLONOSCOPY     last one 2010 approx.   CYSTOCELE REPAIR  2005   and vaginal correction   DILATION AND CURETTAGE OF UTERUS  1967    EXCISION/RELEASE BURSA HIP  11/25/2011   Procedure: EXCISION/RELEASE BURSA HIP;  Surgeon: Gearlean Alf, MD;  Location: [facility];  Service: Orthopedics;  Laterality: Right;  Right Hip Bursectomy with Possible Tendon Repair   FINGER ARTHRODESIS Right 04/2016   right index finger joint   TONSILLECTOMY     child    There were no vitals filed for this visit.   Subjective Assessment - 07/08/21 0934     Subjective Just had one incidence of tightness yesterday in left hip, but today feels fine.    Pertinent History PMH: chronic bil hip pain, osteoporosis, scoliosis, c-sections x 2, cystocele repair    Patient Stated Goals strengthen my stomach muscles and get rid of Lt sided pain    Currently in Pain? No/denies                               [facility] Adult PT Treatment/Exercise - 07/08/21 0001       Self-Care   Self-Care ADL's    ADL's worked on sit <>stand transfers using gait belt on PT to ensure proper form at home with pt's husband. VCs needed for improved squat, cueing for husband, use of belt  vs underarms, and  increased proximity to pt.      Knee/Hip Exercises: Stretches   Other Knee/Hip Stretches windshield wiper stretch x 5 (done in supine)      Knee/Hip Exercises: Standing   Functional Squat 10 reps    Functional Squat Limitations 5# each hand    Other Standing Knee Exercises step ups on stairs are WNL      Shoulder Exercises: Standing   Other Standing Exercises biceps curl with OH press 3# each x 10; cross body punches with hip rotation and mini squat before punch; VCs needed for full left hip IR during punch.                     PT Education - 07/08/21 1130     Education Details Worked on Pharmacist, hospital to assist husband with w/c to chair and supine to stand transfers.    Person(s) Educated Patient    Methods Explanation;Tactile cues;Demonstration;Verbal cues    Comprehension Verbalized understanding;Returned demonstration;Verbal  cues required              PT Short Term Goals - 07/02/21 0858       PT SHORT TERM GOAL #1   Title Pt ind with initial HEP without exacerbation of symptoms    Baseline SDLY hip ABD painful    Status Partially Met               PT Long Term Goals - 07/08/21 1132       PT LONG TERM GOAL #2   Title Pt will be able to demo functional strength for step ups, weighted squats, and lifting with good body mechanics and core use without pain for imrpoved functional task performance.    Baseline met for step ups and weighted squats; needs more review of body mechanics with transfers.    Status Partially Met      PT LONG TERM GOAL #4   Title Pt will be ind with advanced HEP and understand how to progress safely.    Status Partially Met                   Plan - 07/08/21 1131     Clinical Impression Statement Patient presents today with no c/o of hip or back pain. She stated that her husband came home on Saturday from rehab and that she is having to assist him with his transfers at this time. We reviewed body mechanics with squats. Patient still demonstrating internal rotation tightness bil with TE, left greater than right.    Comorbidities osteoporosis, scoliosis, Hx of chronic bil hip pain    PT Treatment/Interventions ADLs/Self Care Home Management;Therapeutic exercise;Functional mobility training;Dry needling;Passive range of motion;Neuromuscular re-education;Manual techniques;Patient/family education;Electrical Stimulation;Cryotherapy;Moist Heat;Iontophoresis 31m/ml Dexamethasone    PT Next Visit Plan Review body mechanics with transfers of husband, continue with hip flexibility    PT Home Exercise Plan Access Code: [email or web address]             Patient will benefit from skilled therapeutic intervention in order to improve the following deficits and impairments:  Decreased range of motion, Increased muscle spasms, Pain, Impaired flexibility, Decreased strength, Decreased  mobility, Postural dysfunction, Decreased balance, Hypomobility  Visit Diagnosis: Left-sided low back pain without sciatica, unspecified chronicity  Pain in left hip  Muscle weakness (generalized)  Abnormal posture     Problem List Patient Active Problem List   Diagnosis Date Noted   Pain of left hip joint 04/05/2020  Throat discomfort 02/29/2020   Eustachian tube dysfunction, left 01/29/2020   Impacted cerumen of right ear 01/29/2020   Arthritis 10/26/2018   Irritable bowel syndrome 10/26/2018   Osteoporosis 10/26/2018   Urinary tract infectious disease 10/26/2018   Carpal tunnel syndrome on left 04/07/2016   Pain in finger of right hand 04/07/2016   Pain in soft tissues of limb 02/29/2016   Primary localized osteoarthrosis, hand 02/25/2016   Dermatochalasis of both upper eyelids 01/12/2014   Peripheral visual field defect 01/12/2014   Droopy eyelid 11/27/2013   Dry eye 11/27/2013   Glaucoma suspect of both eyes 11/27/2013   Other specified hypertrophic and atrophic condition of skin 11/27/2013   VFD (visual field defect) 11/27/2013   Trochanteric bursitis 11/25/2011   Congenital pancreatic cyst 08/11/2011    Madelyn Flavors, PT 07/08/2021, 11:50 AM  [facility] @ [place], [place], [address] Phone: [phone]   Fax:  [phone]  Name: JAYCI ELLEFSON MRN: [id number] Date of Birth: 30-Mar-1941

## 2021-07-10 ENCOUNTER — Ambulatory Visit: Payer: Medicare HMO | Admitting: Rehabilitative and Restorative Service Providers"

## 2021-07-10 ENCOUNTER — Encounter: Payer: Self-pay | Admitting: Rehabilitative and Restorative Service Providers"

## 2021-07-10 ENCOUNTER — Encounter: Payer: Medicare HMO | Admitting: Physical Therapy

## 2021-07-10 ENCOUNTER — Other Ambulatory Visit: Payer: Self-pay

## 2021-07-10 DIAGNOSIS — R293 Abnormal posture: Secondary | ICD-10-CM | POA: Diagnosis not present

## 2021-07-10 DIAGNOSIS — M6281 Muscle weakness (generalized): Secondary | ICD-10-CM | POA: Diagnosis not present

## 2021-07-10 DIAGNOSIS — M25552 Pain in left hip: Secondary | ICD-10-CM | POA: Diagnosis not present

## 2021-07-10 DIAGNOSIS — M545 Low back pain, unspecified: Secondary | ICD-10-CM | POA: Diagnosis not present

## 2021-07-10 NOTE — Therapy (Signed)
[facility] @ [place], [place], [address] Phone: [phone]   Fax:  [phone]  Physical Therapy Treatment  Patient Details  Name: Alicia Bradley MRN: [id number] Date of Birth: 10-Apr-1941 Referring Provider (PT): Derl Barrow, [place]   Encounter Date: 07/10/2021   PT End of Session - 07/10/21 0931     Visit Number 7    Date for PT Re-Evaluation 08/15/21    Authorization Type Aetna    PT Start Time 0930    PT Stop Time 1010    PT Time Calculation (min) 40 min    Activity Tolerance Patient tolerated treatment well    Behavior During Therapy [facility] for tasks assessed/performed             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   GERD (gastroesophageal reflux disease)    Irritable bowel syndrome    Nocturia    OA (osteoarthritis)    Osteoporosis    Presence of pessary    mangaged by gyn-- dr Radene Knee   Presence of surgical incision    03-28-2021   per pt had benign cyst removed from right arm above wrist 03-10-2021 still healing   Urgency incontinence    urologist-- dr winter    Past Surgical History:  Procedure Laterality Date   BLADDER SUSPENSION  2001   transvaginal sling and cystocele repair   BLEPHAROPLASTY W/ LASER Bilateral 12/2013   upper eyelids   BOTOX INJECTION N/A 04/02/2021   Procedure: BOTOX INJECTION WITH CYSTOSCOPY, 100 UNITS;  Surgeon: Ceasar Mons, MD;  Location: [facility];  Service: Urology;  Laterality: N/A;  ONLY NEEDS 30 MIN   CARPAL TUNNEL RELEASE Right 04/2009   AND RIGHT CUBITAL TUNNEL RELEASE   CATARACT EXTRACTION W/ INTRAOCULAR LENS IMPLANT Bilateral 2006   CESAREAN SECTION     x 2  last one 1972   COLONOSCOPY     last one 2010 approx.   CYSTOCELE REPAIR  2005   and vaginal correction   DILATION AND CURETTAGE OF UTERUS  1967   EXCISION/RELEASE  BURSA HIP  11/25/2011   Procedure: EXCISION/RELEASE BURSA HIP;  Surgeon: Gearlean Alf, MD;  Location: [facility];  Service: Orthopedics;  Laterality: Right;  Right Hip Bursectomy with Possible Tendon Repair   FINGER ARTHRODESIS Right 04/2016   right index finger joint   TONSILLECTOMY     child    There were no vitals filed for this visit.   Subjective Assessment - 07/10/21 0930     Subjective Pt reports that last night when she sat down that she felt a tug in her hip, but okay now.    Patient Stated Goals strengthen my stomach muscles and get rid of Lt sided pain    Currently in Pain? No/denies                               [facility] Adult PT Treatment/Exercise - 07/10/21 0001       Self-Care   Self-Care ADL's    ADL's worked on sit <>stand transfers using gait belt on PT to ensure proper form at home with pt's husband. VCs needed for improved squat, cueing for husband, use of belt vs underarms, and  increased proximity to pt.  Knee/Hip Exercises: Aerobic   Nustep L3 x6 min   seat 8     Knee/Hip Exercises: Machines for Strengthening   Hip Cybex 45# extension x 15 bil, 40# hip abd x15 B    Other Machine Rows 25# 2x10, Lats 30# 2x10      Knee/Hip Exercises: Standing   Functional Squat 15 reps    Functional Squat Limitations 5# each hand    Other Standing Knee Exercises OHP with 3# B 2x10                       PT Short Term Goals - 07/10/21 1016       PT SHORT TERM GOAL #1   Title Pt ind with initial HEP without exacerbation of symptoms    Status Achieved               PT Long Term Goals - 07/08/21 1132       PT LONG TERM GOAL #2   Title Pt will be able to demo functional strength for step ups, weighted squats, and lifting with good body mechanics and core use without pain for imrpoved functional task performance.    Baseline met for step ups and weighted squats; needs more review of body mechanics with transfers.    Status  Partially Met      PT LONG TERM GOAL #4   Title Pt will be ind with advanced HEP and understand how to progress safely.    Status Partially Met                   Plan - 07/10/21 1011     Clinical Impression Statement Pt presents today and states that she is doing better with transfering her husband, but would like some more training to ensure that she is using the best method to not injure herself. Pt required cuing to remember to stay close to her husband to decrease strain on her back. Provided written instructions for patient to allow her to perform at home. Added some upper body strengthening and core strengthening to allow pt to more easily transfer her husband. Pt to take a week off, then come back to update HEP and assess if further visits are indicated.    PT Treatment/Interventions ADLs/Self Care Home Management;Therapeutic exercise;Functional mobility training;Dry needling;Passive range of motion;Neuromuscular re-education;Manual techniques;Patient/family education;Electrical Stimulation;Cryotherapy;Moist Heat;Iontophoresis 80m/ml Dexamethasone    PT Next Visit Plan Review body mechanics with transfers of husband, continue with hip flexibility    Consulted and Agree with Plan of Care Patient             Patient will benefit from skilled therapeutic intervention in order to improve the following deficits and impairments:  Decreased range of motion, Increased muscle spasms, Pain, Impaired flexibility, Decreased strength, Decreased mobility, Postural dysfunction, Decreased balance, Hypomobility  Visit Diagnosis: Left-sided low back pain without sciatica, unspecified chronicity  Pain in left hip  Muscle weakness (generalized)  Abnormal posture     Problem List Patient Active Problem List   Diagnosis Date Noted   Pain of left hip joint 04/05/2020   Throat discomfort 02/29/2020   Eustachian tube dysfunction, left 01/29/2020   Impacted cerumen of right ear  01/29/2020   Arthritis 10/26/2018   Irritable bowel syndrome 10/26/2018   Osteoporosis 10/26/2018   Urinary tract infectious disease 10/26/2018   Carpal tunnel syndrome on left 04/07/2016   Pain in finger of right hand 04/07/2016   Pain in soft tissues of  limb 02/29/2016   Primary localized osteoarthrosis, hand 02/25/2016   Dermatochalasis of both upper eyelids 01/12/2014   Peripheral visual field defect 01/12/2014   Droopy eyelid 11/27/2013   Dry eye 11/27/2013   Glaucoma suspect of both eyes 11/27/2013   Other specified hypertrophic and atrophic condition of skin 11/27/2013   VFD (visual field defect) 11/27/2013   Trochanteric bursitis 11/25/2011   Congenital pancreatic cyst 08/11/2011    Juel Burrow, PT, DPT 07/10/2021, 10:19 AM  [facility] @ [place], [place], [address] Phone: [phone]   Fax:  [phone]  Name: Alicia Bradley MRN: [id number] Date of Birth: 1941-06-02

## 2021-07-12 DIAGNOSIS — M25551 Pain in right hip: Secondary | ICD-10-CM | POA: Diagnosis not present

## 2021-07-22 ENCOUNTER — Encounter: Payer: Self-pay | Admitting: Rehabilitative and Restorative Service Providers"

## 2021-07-22 ENCOUNTER — Ambulatory Visit: Payer: Medicare HMO | Admitting: Rehabilitative and Restorative Service Providers"

## 2021-07-22 ENCOUNTER — Other Ambulatory Visit: Payer: Self-pay

## 2021-07-22 DIAGNOSIS — M545 Low back pain, unspecified: Secondary | ICD-10-CM | POA: Diagnosis not present

## 2021-07-22 DIAGNOSIS — M25552 Pain in left hip: Secondary | ICD-10-CM | POA: Diagnosis not present

## 2021-07-22 DIAGNOSIS — M6281 Muscle weakness (generalized): Secondary | ICD-10-CM

## 2021-07-22 DIAGNOSIS — R293 Abnormal posture: Secondary | ICD-10-CM | POA: Diagnosis not present

## 2021-07-22 NOTE — Therapy (Addendum)
[facility] @ [place], [place], [address] Phone: [phone]   Fax:  [phone]  Physical Therapy Treatment  Patient Details  Name: Alicia Bradley MRN: [id number] Date of Birth: [birth year] Referring Provider (PT): Derl Barrow, [place]   Encounter Date: 07/22/2021   PT End of Session - 07/22/21 1009     Visit Number 8    Date for PT Re-Evaluation 08/15/21    Authorization Type [employer]    PT Start Time 0930    PT Stop Time 1008    PT Time Calculation (min) 38 min    Activity Tolerance Patient tolerated treatment well    Behavior During Therapy [facility] for tasks assessed/performed             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   GERD (gastroesophageal reflux disease)    Irritable bowel syndrome    Nocturia    OA (osteoarthritis)    Osteoporosis    Presence of pessary    mangaged by gyn-- dr Radene Knee   Presence of surgical incision    03-28-2021   per pt had benign cyst removed from right arm above wrist 03-10-2021 still healing   Urgency incontinence    urologist-- dr winter    Past Surgical History:  Procedure Laterality Date   BLADDER SUSPENSION  2001   transvaginal sling and cystocele repair   BLEPHAROPLASTY W/ LASER Bilateral 12/2013   upper eyelids   BOTOX INJECTION N/A 04/02/2021   Procedure: BOTOX INJECTION WITH CYSTOSCOPY, 100 UNITS;  Surgeon: Ceasar Mons, MD;  Location: [facility];  Service: Urology;  Laterality: N/A;  ONLY NEEDS 30 MIN   CARPAL TUNNEL RELEASE Right 04/2009   AND RIGHT CUBITAL TUNNEL RELEASE   CATARACT EXTRACTION W/ INTRAOCULAR LENS IMPLANT Bilateral 2006   CESAREAN SECTION     x 2  last one 1972   COLONOSCOPY     last one 2010 approx.   CYSTOCELE REPAIR  2005   and vaginal correction   DILATION AND CURETTAGE OF UTERUS  1967   EXCISION/RELEASE  BURSA HIP  11/25/2011   Procedure: EXCISION/RELEASE BURSA HIP;  Surgeon: Gearlean Alf, MD;  Location: [facility];  Service: Orthopedics;  Laterality: Right;  Right Hip Bursectomy with Possible Tendon Repair   FINGER ARTHRODESIS Right 04/2016   right index finger joint   TONSILLECTOMY     child    There were no vitals filed for this visit.   Subjective Assessment - 07/22/21 0943     Subjective Pt reports that following PT session last time, she had increased pain to 10/10 in her hips.  She reports that she went to the ortho urgent care and was diagnosed with R hip bursitis and got an injection that helped relieve her pain.  Pt anxious during session today as she does not want to make her hip hurt worse.    Patient Stated Goals strengthen my stomach muscles and get rid of Lt sided pain    Currently in Pain? Yes    Pain Score 4     Pain Location Hip    Pain Orientation Right                               [facility] Adult PT Treatment/Exercise -  07/22/21 0001       Knee/Hip Exercises: Stretches   Passive Hamstring Stretch Right;Left;3 reps;10 seconds    Passive Hamstring Stretch Limitations with strap    ITB Stretch Both;3 reps;10 seconds    ITB Stretch Limitations with strap and in standing.  Pt reports standing worked better for IT band stretch    Other Knee/Hip Stretches windshield wiper stretch x 10 (done in supine)      Knee/Hip Exercises: Aerobic   Nustep L5 x1 min   stopped due to pt fear of increased pain     Manual Therapy   Manual Therapy Soft tissue mobilization;Myofascial release    Soft tissue mobilization STM to B lateral thighs, B gluts, B piriformis,                       PT Short Term Goals - 07/10/21 1016       PT SHORT TERM GOAL #1   Title Pt ind with initial HEP without exacerbation of symptoms    Status Achieved               PT Long Term Goals - 07/22/21 1023       PT LONG TERM GOAL #1   Title Pt will be able to  perform household and yard tasks without sharp pains at least 70% of the time.    Status Partially Met      PT LONG TERM GOAL #2   Title Pt will be able to demo functional strength for step ups, weighted squats, and lifting with good body mechanics and core use without pain for imrpoved functional task performance.    Status Partially Met      PT LONG TERM GOAL #3   Title Pt will improve FOTO score to at least 77% to demo improved function.    Status On-going      PT LONG TERM GOAL #4   Title Pt will be ind with advanced HEP and understand how to progress safely.    Status Partially Met      PT LONG TERM GOAL #5   Title Pt will achieve Lt hip ROM to symmetrical with Rt without pain for improved functional use of bil LEs    Status On-going                   Plan - 07/22/21 1010     Clinical Impression Statement Focus of todays session was manual therapy to assist with increased pain from last session. Following stretching and manual therapy, pt with pain decreasing to 0/10 upon leaving session.  Pt tolerated standing stretch against wall better than supine IT band stretch with strap in supine. Pt with trigger points noted in bilateral piriformis released with manual trigger point release. Pt states that transfers with her husband at home are going well and able to demonstrate good mechanics.    PT Treatment/Interventions ADLs/Self Care Home Management;Therapeutic exercise;Functional mobility training;Dry needling;Passive range of motion;Neuromuscular re-education;Manual techniques;Patient/family education;Electrical Stimulation;Cryotherapy;Moist Heat;Iontophoresis 16m/ml Dexamethasone    PT Next Visit Plan manual therapy as indicated, stretching, core stability    Consulted and Agree with Plan of Care Patient             Patient will benefit from skilled therapeutic intervention in order to improve the following deficits and impairments:  Decreased range of motion, Increased  muscle spasms, Pain, Impaired flexibility, Decreased strength, Decreased mobility, Postural dysfunction, Decreased balance, Hypomobility  Visit Diagnosis: Left-sided low back  pain without sciatica, unspecified chronicity  Pain in left hip  Muscle weakness (generalized)  Abnormal posture     Problem List Patient Active Problem List   Diagnosis Date Noted   Pain of left hip joint 04/05/2020   Throat discomfort 02/29/2020   Eustachian tube dysfunction, left 01/29/2020   Impacted cerumen of right ear 01/29/2020   Arthritis 10/26/2018   Irritable bowel syndrome 10/26/2018   Osteoporosis 10/26/2018   Urinary tract infectious disease 10/26/2018   Carpal tunnel syndrome on left 04/07/2016   Pain in finger of right hand 04/07/2016   Pain in soft tissues of limb 02/29/2016   Primary localized osteoarthrosis, hand 02/25/2016   Dermatochalasis of both upper eyelids 01/12/2014   Peripheral visual field defect 01/12/2014   Droopy eyelid 11/27/2013   Dry eye 11/27/2013   Glaucoma suspect of both eyes 11/27/2013   Other specified hypertrophic and atrophic condition of skin 11/27/2013   VFD (visual field defect) 11/27/2013   Trochanteric bursitis 11/25/2011   Congenital pancreatic cyst 08/11/2011   PHYSICAL THERAPY DISCHARGE SUMMARY  Patient agrees to discharge. Patient goals were not met. Patient is being discharged due to not returning since the last visit.  Pt cancelled last scheduled visit and did not reschedule for future visits.  Pt was compliant with HEP at last visit.   Juel Burrow, PT, DPT 07/22/2021, 10:27 AM  [facility] @ [place], [place], [address] Phone: [phone]   Fax:  [phone]  Name: Alicia Bradley MRN: [id number] Date of Birth: 07/01/1941

## 2021-07-25 DIAGNOSIS — Z23 Encounter for immunization: Secondary | ICD-10-CM | POA: Diagnosis not present

## 2021-07-31 DIAGNOSIS — R3914 Feeling of incomplete bladder emptying: Secondary | ICD-10-CM | POA: Diagnosis not present

## 2021-08-01 DIAGNOSIS — H40023 Open angle with borderline findings, high risk, bilateral: Secondary | ICD-10-CM | POA: Diagnosis not present

## 2021-08-01 DIAGNOSIS — H02035 Senile entropion of left lower eyelid: Secondary | ICD-10-CM | POA: Diagnosis not present

## 2021-08-05 DIAGNOSIS — M5416 Radiculopathy, lumbar region: Secondary | ICD-10-CM | POA: Diagnosis not present

## 2021-08-08 DIAGNOSIS — N390 Urinary tract infection, site not specified: Secondary | ICD-10-CM | POA: Diagnosis not present

## 2021-08-08 DIAGNOSIS — R300 Dysuria: Secondary | ICD-10-CM | POA: Diagnosis not present

## 2021-08-11 DIAGNOSIS — G7080 Lambert-Eaton syndrome, unspecified: Secondary | ICD-10-CM | POA: Diagnosis not present

## 2021-08-11 DIAGNOSIS — M19041 Primary osteoarthritis, right hand: Secondary | ICD-10-CM | POA: Diagnosis not present

## 2021-08-11 DIAGNOSIS — M1812 Unilateral primary osteoarthritis of first carpometacarpal joint, left hand: Secondary | ICD-10-CM | POA: Diagnosis not present

## 2021-08-11 DIAGNOSIS — M19042 Primary osteoarthritis, left hand: Secondary | ICD-10-CM | POA: Diagnosis not present

## 2021-08-27 DIAGNOSIS — N8182 Incompetence or weakening of pubocervical tissue: Secondary | ICD-10-CM | POA: Diagnosis not present

## 2021-09-03 DIAGNOSIS — N8111 Cystocele, midline: Secondary | ICD-10-CM | POA: Diagnosis not present

## 2021-09-03 DIAGNOSIS — R300 Dysuria: Secondary | ICD-10-CM | POA: Diagnosis not present

## 2021-09-24 DIAGNOSIS — M5416 Radiculopathy, lumbar region: Secondary | ICD-10-CM | POA: Diagnosis not present

## 2021-09-27 DIAGNOSIS — M5459 Other low back pain: Secondary | ICD-10-CM | POA: Diagnosis not present

## 2021-10-03 DIAGNOSIS — M47816 Spondylosis without myelopathy or radiculopathy, lumbar region: Secondary | ICD-10-CM | POA: Diagnosis not present

## 2021-10-03 DIAGNOSIS — M5416 Radiculopathy, lumbar region: Secondary | ICD-10-CM | POA: Diagnosis not present

## 2021-10-08 DIAGNOSIS — N8182 Incompetence or weakening of pubocervical tissue: Secondary | ICD-10-CM | POA: Diagnosis not present

## 2021-10-09 DIAGNOSIS — M5136 Other intervertebral disc degeneration, lumbar region: Secondary | ICD-10-CM | POA: Diagnosis not present

## 2021-10-11 DIAGNOSIS — M25572 Pain in left ankle and joints of left foot: Secondary | ICD-10-CM | POA: Diagnosis not present

## 2021-10-14 DIAGNOSIS — M810 Age-related osteoporosis without current pathological fracture: Secondary | ICD-10-CM | POA: Diagnosis not present

## 2021-10-14 DIAGNOSIS — Z79899 Other long term (current) drug therapy: Secondary | ICD-10-CM | POA: Diagnosis not present

## 2021-10-14 DIAGNOSIS — E785 Hyperlipidemia, unspecified: Secondary | ICD-10-CM | POA: Diagnosis not present

## 2021-10-21 DIAGNOSIS — N3281 Overactive bladder: Secondary | ICD-10-CM | POA: Diagnosis not present

## 2021-10-21 DIAGNOSIS — M810 Age-related osteoporosis without current pathological fracture: Secondary | ICD-10-CM | POA: Diagnosis not present

## 2021-10-21 DIAGNOSIS — E785 Hyperlipidemia, unspecified: Secondary | ICD-10-CM | POA: Diagnosis not present

## 2021-10-21 DIAGNOSIS — Z1331 Encounter for screening for depression: Secondary | ICD-10-CM | POA: Diagnosis not present

## 2021-10-21 DIAGNOSIS — R82998 Other abnormal findings in urine: Secondary | ICD-10-CM | POA: Diagnosis not present

## 2021-10-21 DIAGNOSIS — Z0000 Encounter for general adult medical examination without abnormal findings: Secondary | ICD-10-CM | POA: Diagnosis not present

## 2021-10-21 DIAGNOSIS — Z1339 Encounter for screening examination for other mental health and behavioral disorders: Secondary | ICD-10-CM | POA: Diagnosis not present

## 2021-10-28 DIAGNOSIS — M47816 Spondylosis without myelopathy or radiculopathy, lumbar region: Secondary | ICD-10-CM | POA: Diagnosis not present

## 2021-10-29 DIAGNOSIS — R3914 Feeling of incomplete bladder emptying: Secondary | ICD-10-CM | POA: Diagnosis not present

## 2021-10-29 DIAGNOSIS — R300 Dysuria: Secondary | ICD-10-CM | POA: Diagnosis not present

## 2021-10-29 DIAGNOSIS — R351 Nocturia: Secondary | ICD-10-CM | POA: Diagnosis not present

## 2021-11-12 DIAGNOSIS — M47816 Spondylosis without myelopathy or radiculopathy, lumbar region: Secondary | ICD-10-CM | POA: Diagnosis not present

## 2021-11-25 DIAGNOSIS — L821 Other seborrheic keratosis: Secondary | ICD-10-CM | POA: Diagnosis not present

## 2021-11-25 DIAGNOSIS — D485 Neoplasm of uncertain behavior of skin: Secondary | ICD-10-CM | POA: Diagnosis not present

## 2021-11-25 DIAGNOSIS — L720 Epidermal cyst: Secondary | ICD-10-CM | POA: Diagnosis not present

## 2021-11-25 DIAGNOSIS — D1801 Hemangioma of skin and subcutaneous tissue: Secondary | ICD-10-CM | POA: Diagnosis not present

## 2021-11-25 DIAGNOSIS — L570 Actinic keratosis: Secondary | ICD-10-CM | POA: Diagnosis not present

## 2021-11-25 DIAGNOSIS — L814 Other melanin hyperpigmentation: Secondary | ICD-10-CM | POA: Diagnosis not present

## 2021-11-25 DIAGNOSIS — L820 Inflamed seborrheic keratosis: Secondary | ICD-10-CM | POA: Diagnosis not present

## 2021-11-25 DIAGNOSIS — D692 Other nonthrombocytopenic purpura: Secondary | ICD-10-CM | POA: Diagnosis not present

## 2021-12-02 DIAGNOSIS — M25642 Stiffness of left hand, not elsewhere classified: Secondary | ICD-10-CM | POA: Diagnosis not present

## 2021-12-02 DIAGNOSIS — M19042 Primary osteoarthritis, left hand: Secondary | ICD-10-CM | POA: Diagnosis not present

## 2021-12-02 DIAGNOSIS — M25641 Stiffness of right hand, not elsewhere classified: Secondary | ICD-10-CM | POA: Diagnosis not present

## 2021-12-02 DIAGNOSIS — M25542 Pain in joints of left hand: Secondary | ICD-10-CM | POA: Diagnosis not present

## 2021-12-02 DIAGNOSIS — M19041 Primary osteoarthritis, right hand: Secondary | ICD-10-CM | POA: Diagnosis not present

## 2021-12-02 DIAGNOSIS — M25541 Pain in joints of right hand: Secondary | ICD-10-CM | POA: Diagnosis not present

## 2021-12-03 DIAGNOSIS — N8189 Other female genital prolapse: Secondary | ICD-10-CM | POA: Diagnosis not present

## 2021-12-05 DIAGNOSIS — R0789 Other chest pain: Secondary | ICD-10-CM | POA: Diagnosis not present

## 2021-12-05 DIAGNOSIS — H6123 Impacted cerumen, bilateral: Secondary | ICD-10-CM | POA: Diagnosis not present

## 2021-12-15 IMAGING — MG DIGITAL SCREENING BILAT W/ TOMO W/ CAD
8 series · 8 of 24 positions shown · non-contrast
Comparison: Previous exam(s).

CLINICAL DATA: Screening.

EXAM:
DIGITAL SCREENING BILATERAL MAMMOGRAM WITH TOMO AND CAD

[R MLO synth-2D]
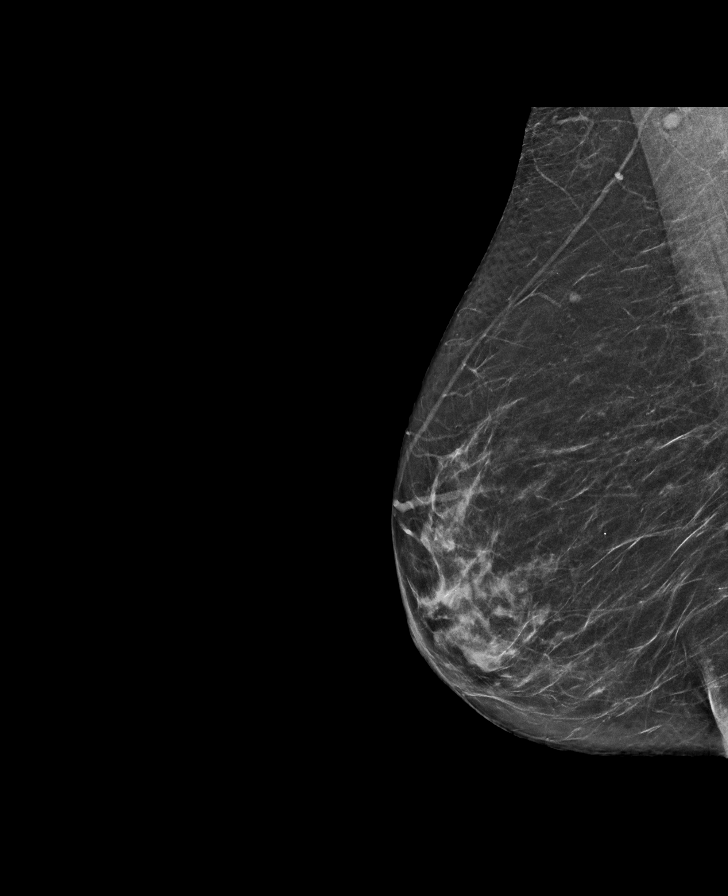

[L MLO synth-2D]
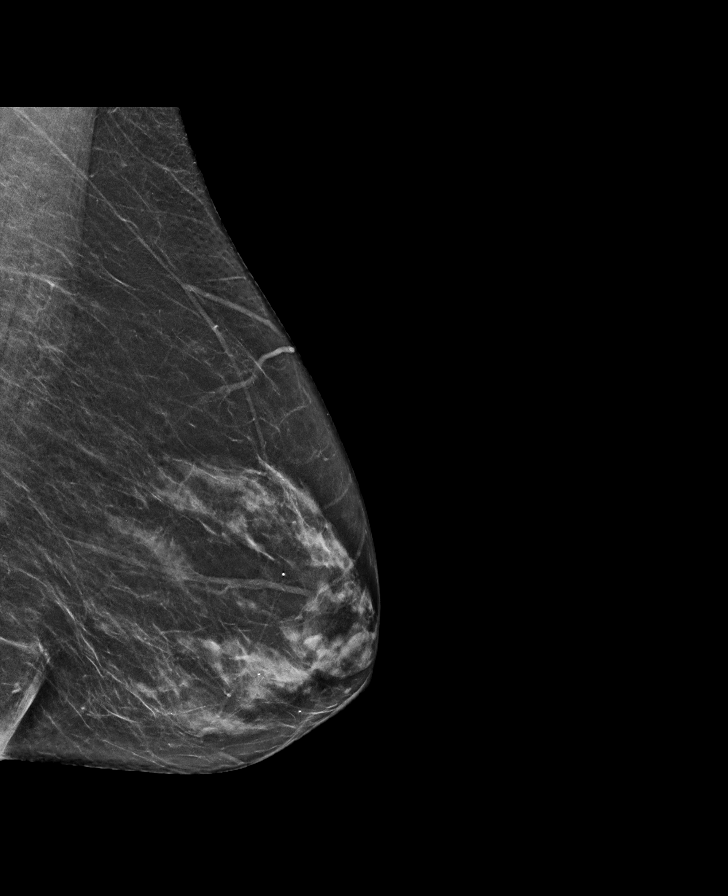

[L CC synth-2D]
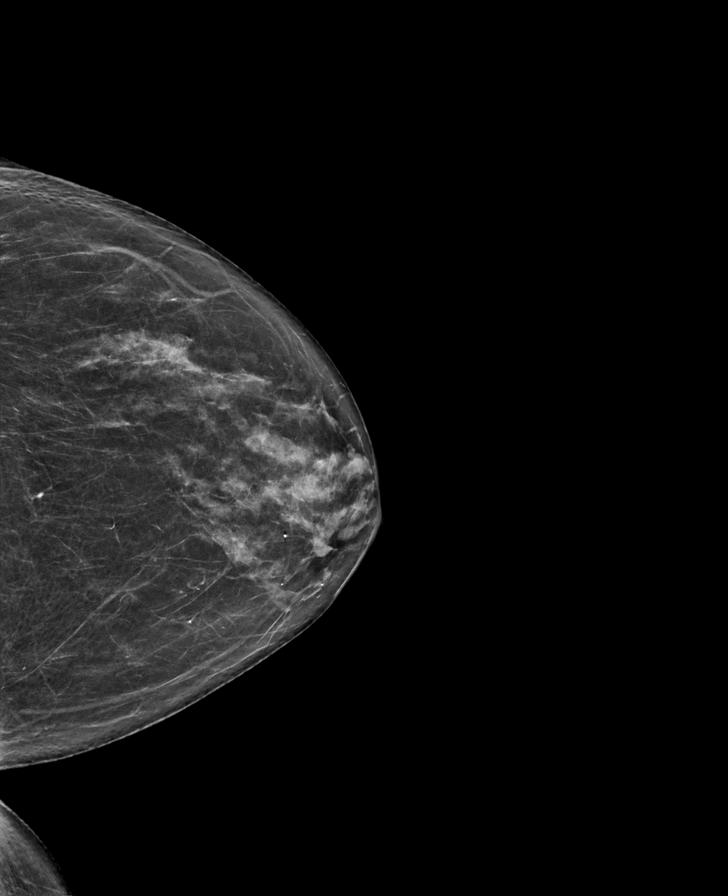

[R CC synth-2D]
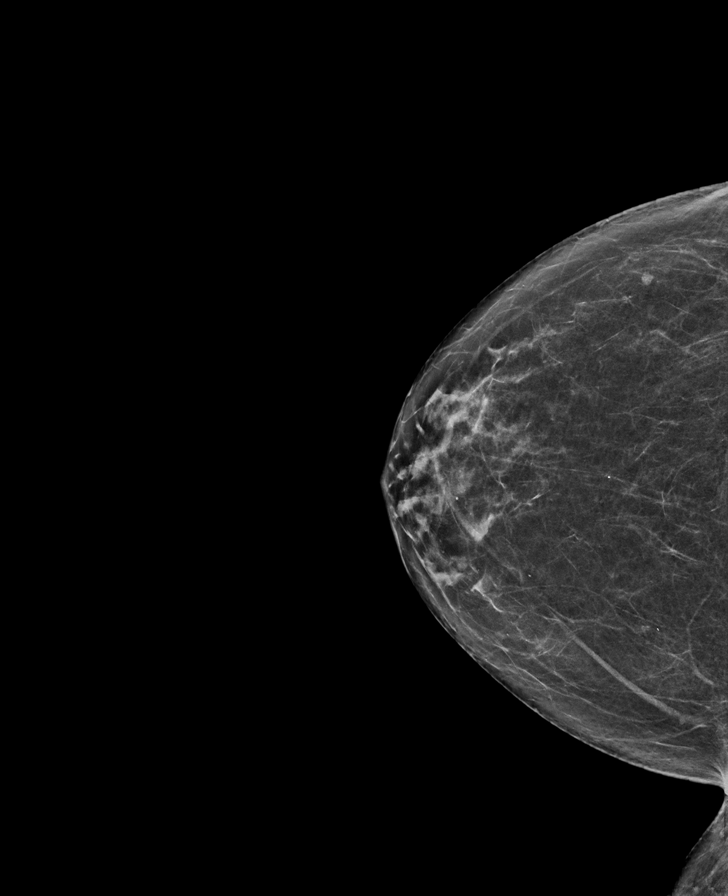

[L CC tomo · tomo slice 33/64.0]
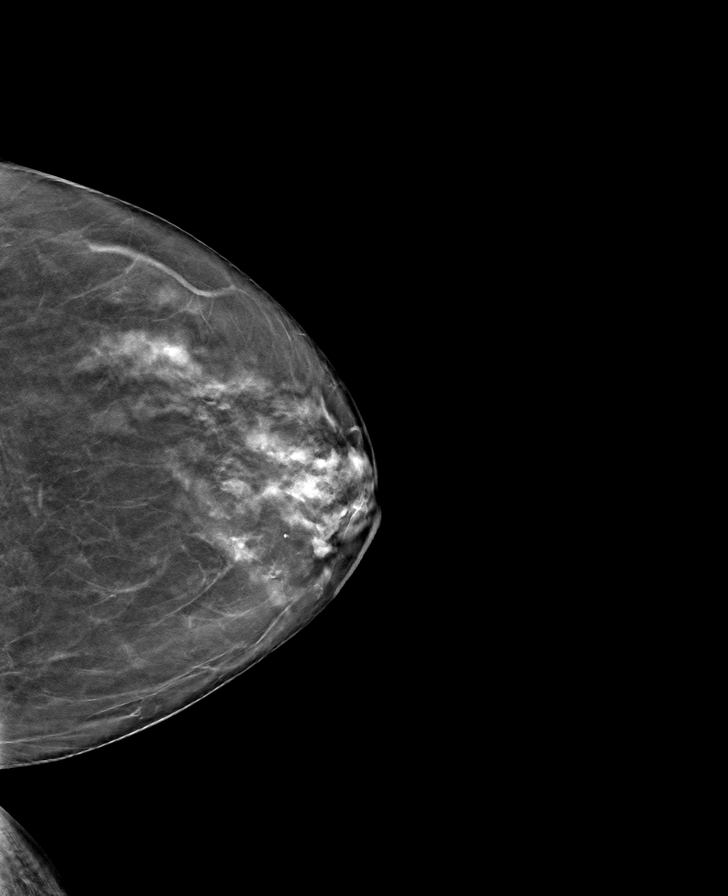

[R MLO tomo · tomo slice 31/61.0]
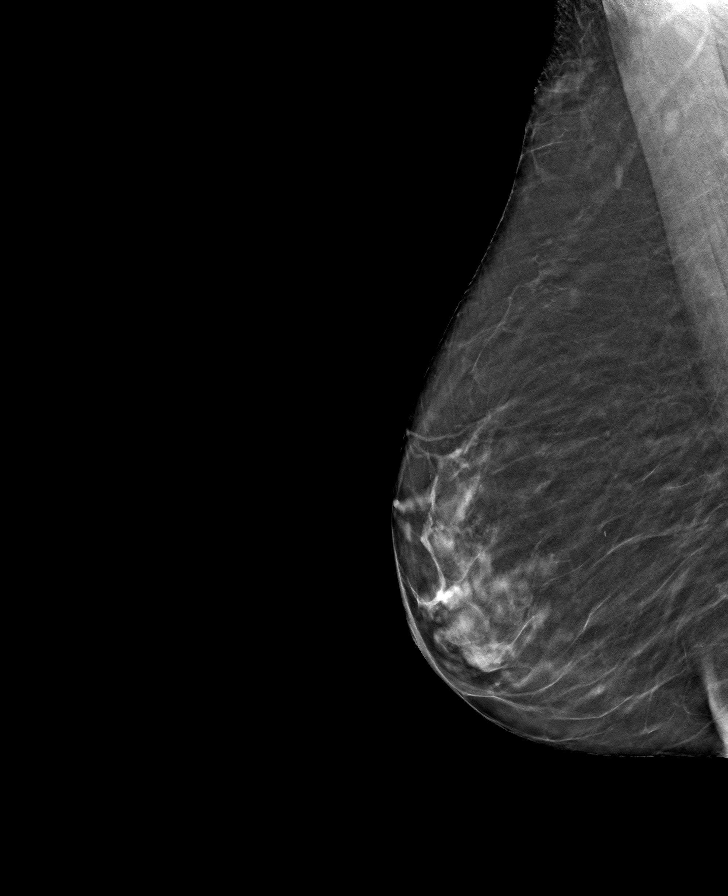

[L MLO tomo · tomo slice 32/63.0]
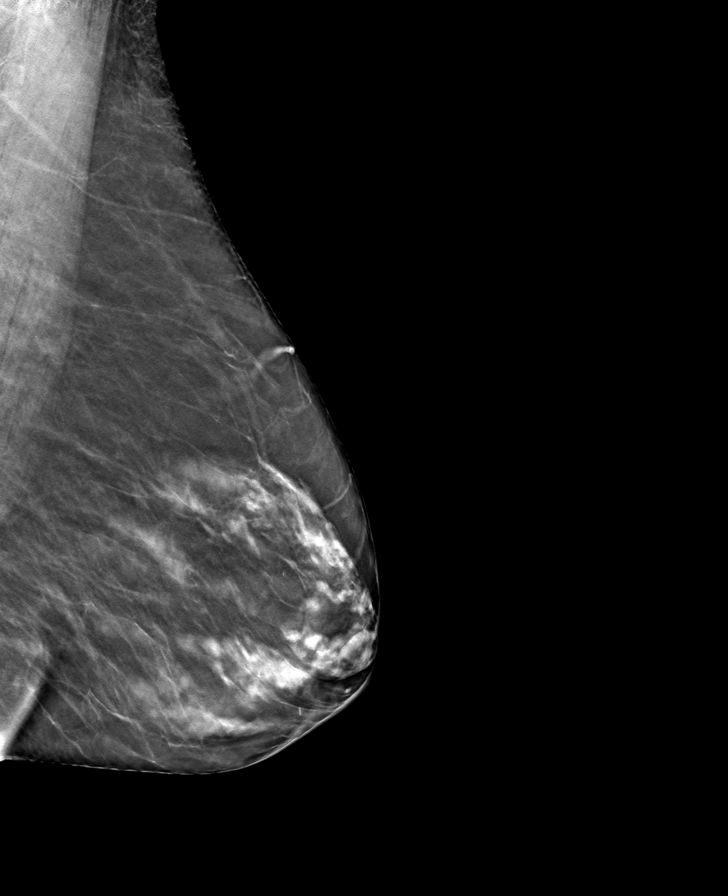

[R CC tomo · tomo slice 33/64.0]
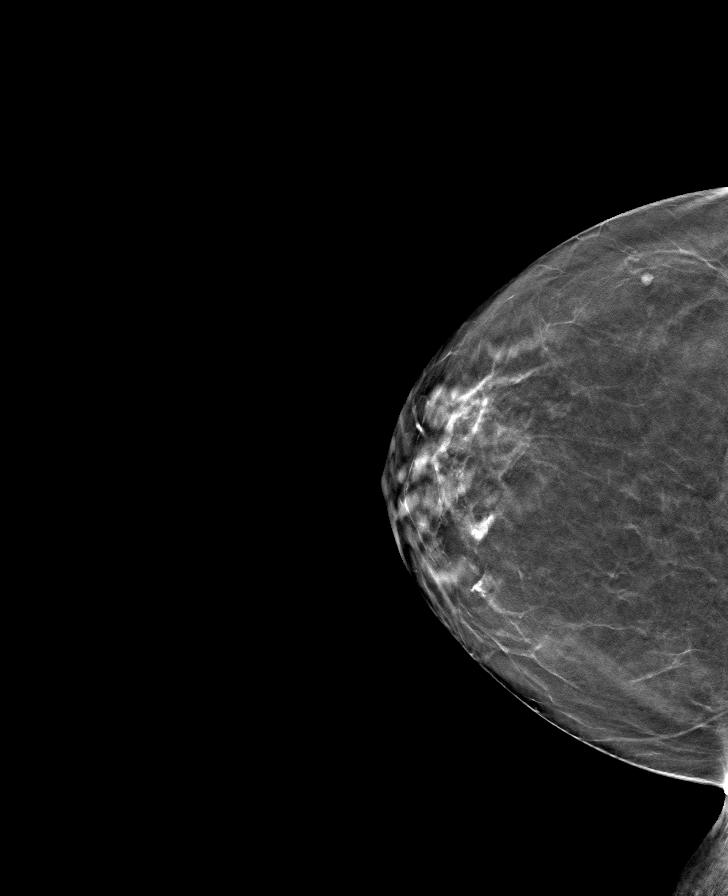

[8 of 24 positions shown; findings below may reference images not displayed]

ACR Breast Density Category b: There are scattered areas of
fibroglandular density.
FINDINGS: There are no findings suspicious for malignancy. Images were
processed with CAD.
IMPRESSION: No mammographic evidence of malignancy. A result letter of this
screening mammogram will be mailed directly to the patient.

RECOMMENDATION:
Screening mammogram in one year. (Code:[id number])

BI-RADS CATEGORY  1: Negative.

## 2022-01-14 DIAGNOSIS — Z4689 Encounter for fitting and adjustment of other specified devices: Secondary | ICD-10-CM | POA: Diagnosis not present

## 2022-02-03 DIAGNOSIS — H524 Presbyopia: Secondary | ICD-10-CM | POA: Diagnosis not present

## 2022-02-03 DIAGNOSIS — H04123 Dry eye syndrome of bilateral lacrimal glands: Secondary | ICD-10-CM | POA: Diagnosis not present

## 2022-02-03 DIAGNOSIS — H40023 Open angle with borderline findings, high risk, bilateral: Secondary | ICD-10-CM | POA: Diagnosis not present

## 2022-02-03 DIAGNOSIS — H52223 Regular astigmatism, bilateral: Secondary | ICD-10-CM | POA: Diagnosis not present

## 2022-03-04 DIAGNOSIS — N8182 Incompetence or weakening of pubocervical tissue: Secondary | ICD-10-CM | POA: Diagnosis not present

## 2022-03-19 DIAGNOSIS — N898 Other specified noninflammatory disorders of vagina: Secondary | ICD-10-CM | POA: Diagnosis not present

## 2022-03-19 DIAGNOSIS — N8189 Other female genital prolapse: Secondary | ICD-10-CM | POA: Diagnosis not present

## 2022-03-20 DIAGNOSIS — R8279 Other abnormal findings on microbiological examination of urine: Secondary | ICD-10-CM | POA: Diagnosis not present

## 2022-03-26 DIAGNOSIS — R42 Dizziness and giddiness: Secondary | ICD-10-CM | POA: Diagnosis not present

## 2022-03-26 DIAGNOSIS — N390 Urinary tract infection, site not specified: Secondary | ICD-10-CM | POA: Diagnosis not present

## 2022-03-26 DIAGNOSIS — H8110 Benign paroxysmal vertigo, unspecified ear: Secondary | ICD-10-CM | POA: Diagnosis not present

## 2022-03-26 DIAGNOSIS — I951 Orthostatic hypotension: Secondary | ICD-10-CM | POA: Diagnosis not present

## 2022-03-26 DIAGNOSIS — R197 Diarrhea, unspecified: Secondary | ICD-10-CM | POA: Diagnosis not present

## 2022-03-27 DIAGNOSIS — E785 Hyperlipidemia, unspecified: Secondary | ICD-10-CM | POA: Diagnosis not present

## 2022-03-27 DIAGNOSIS — M810 Age-related osteoporosis without current pathological fracture: Secondary | ICD-10-CM | POA: Diagnosis not present

## 2022-04-07 ENCOUNTER — Other Ambulatory Visit: Payer: Self-pay | Admitting: Internal Medicine

## 2022-04-07 DIAGNOSIS — Z1231 Encounter for screening mammogram for malignant neoplasm of breast: Secondary | ICD-10-CM

## 2022-04-20 DIAGNOSIS — H52201 Unspecified astigmatism, right eye: Secondary | ICD-10-CM | POA: Diagnosis not present

## 2022-05-07 ENCOUNTER — Ambulatory Visit: Admission: RE | Admit: 2022-05-07 | Payer: Medicare HMO | Source: Ambulatory Visit

## 2022-05-07 ENCOUNTER — Ambulatory Visit
Admission: RE | Admit: 2022-05-07 | Discharge: 2022-05-07 | Disposition: A | Discharge status: Home / Self Care | Payer: Medicare HMO | Source: Ambulatory Visit | Attending: Internal Medicine | Admitting: Internal Medicine

## 2022-05-07 DIAGNOSIS — Z1231 Encounter for screening mammogram for malignant neoplasm of breast: Secondary | ICD-10-CM

## 2022-05-08 DIAGNOSIS — M545 Low back pain, unspecified: Secondary | ICD-10-CM | POA: Diagnosis not present

## 2022-05-22 DIAGNOSIS — M545 Low back pain, unspecified: Secondary | ICD-10-CM | POA: Diagnosis not present

## 2022-05-25 DIAGNOSIS — M47896 Other spondylosis, lumbar region: Secondary | ICD-10-CM | POA: Diagnosis not present

## 2022-05-25 DIAGNOSIS — M5416 Radiculopathy, lumbar region: Secondary | ICD-10-CM | POA: Diagnosis not present

## 2022-05-25 DIAGNOSIS — M545 Low back pain, unspecified: Secondary | ICD-10-CM | POA: Diagnosis not present

## 2022-05-25 DIAGNOSIS — M4186 Other forms of scoliosis, lumbar region: Secondary | ICD-10-CM | POA: Diagnosis not present

## 2022-05-26 DIAGNOSIS — N8182 Incompetence or weakening of pubocervical tissue: Secondary | ICD-10-CM | POA: Diagnosis not present

## 2022-05-28 DIAGNOSIS — M5416 Radiculopathy, lumbar region: Secondary | ICD-10-CM | POA: Diagnosis not present

## 2022-06-03 DIAGNOSIS — M5416 Radiculopathy, lumbar region: Secondary | ICD-10-CM | POA: Diagnosis not present

## 2022-06-03 DIAGNOSIS — M4186 Other forms of scoliosis, lumbar region: Secondary | ICD-10-CM | POA: Diagnosis not present

## 2022-06-25 DIAGNOSIS — M5416 Radiculopathy, lumbar region: Secondary | ICD-10-CM | POA: Diagnosis not present

## 2022-06-25 DIAGNOSIS — M47896 Other spondylosis, lumbar region: Secondary | ICD-10-CM | POA: Diagnosis not present

## 2022-06-25 DIAGNOSIS — M545 Low back pain, unspecified: Secondary | ICD-10-CM | POA: Diagnosis not present

## 2022-07-06 DIAGNOSIS — N8182 Incompetence or weakening of pubocervical tissue: Secondary | ICD-10-CM | POA: Diagnosis not present

## 2022-08-11 DIAGNOSIS — H40023 Open angle with borderline findings, high risk, bilateral: Secondary | ICD-10-CM | POA: Diagnosis not present

## 2022-08-11 DIAGNOSIS — H04123 Dry eye syndrome of bilateral lacrimal glands: Secondary | ICD-10-CM | POA: Diagnosis not present

## 2022-08-11 DIAGNOSIS — H43813 Vitreous degeneration, bilateral: Secondary | ICD-10-CM | POA: Diagnosis not present

## 2022-08-12 DIAGNOSIS — N8182 Incompetence or weakening of pubocervical tissue: Secondary | ICD-10-CM | POA: Diagnosis not present

## 2022-08-27 DIAGNOSIS — H6992 Unspecified Eustachian tube disorder, left ear: Secondary | ICD-10-CM | POA: Diagnosis not present

## 2022-08-27 DIAGNOSIS — H6123 Impacted cerumen, bilateral: Secondary | ICD-10-CM | POA: Diagnosis not present

## 2022-09-14 DIAGNOSIS — N8182 Incompetence or weakening of pubocervical tissue: Secondary | ICD-10-CM | POA: Diagnosis not present

## 2022-10-12 DIAGNOSIS — N8182 Incompetence or weakening of pubocervical tissue: Secondary | ICD-10-CM | POA: Diagnosis not present

## 2022-10-22 DIAGNOSIS — R351 Nocturia: Secondary | ICD-10-CM | POA: Diagnosis not present

## 2022-10-22 DIAGNOSIS — N3281 Overactive bladder: Secondary | ICD-10-CM | POA: Diagnosis not present

## 2022-10-23 DIAGNOSIS — R7989 Other specified abnormal findings of blood chemistry: Secondary | ICD-10-CM | POA: Diagnosis not present

## 2022-10-23 DIAGNOSIS — E785 Hyperlipidemia, unspecified: Secondary | ICD-10-CM | POA: Diagnosis not present

## 2022-10-23 DIAGNOSIS — M810 Age-related osteoporosis without current pathological fracture: Secondary | ICD-10-CM | POA: Diagnosis not present

## 2022-10-23 DIAGNOSIS — Z79899 Other long term (current) drug therapy: Secondary | ICD-10-CM | POA: Diagnosis not present

## 2022-10-29 DIAGNOSIS — K589 Irritable bowel syndrome without diarrhea: Secondary | ICD-10-CM | POA: Diagnosis not present

## 2022-10-29 DIAGNOSIS — I700 Atherosclerosis of aorta: Secondary | ICD-10-CM | POA: Diagnosis not present

## 2022-10-29 DIAGNOSIS — Z1331 Encounter for screening for depression: Secondary | ICD-10-CM | POA: Diagnosis not present

## 2022-10-29 DIAGNOSIS — Z13828 Encounter for screening for other musculoskeletal disorder: Secondary | ICD-10-CM | POA: Diagnosis not present

## 2022-10-29 DIAGNOSIS — R69 Illness, unspecified: Secondary | ICD-10-CM | POA: Diagnosis not present

## 2022-10-29 DIAGNOSIS — N3281 Overactive bladder: Secondary | ICD-10-CM | POA: Diagnosis not present

## 2022-10-29 DIAGNOSIS — D692 Other nonthrombocytopenic purpura: Secondary | ICD-10-CM | POA: Diagnosis not present

## 2022-10-29 DIAGNOSIS — R82998 Other abnormal findings in urine: Secondary | ICD-10-CM | POA: Diagnosis not present

## 2022-10-29 DIAGNOSIS — Z1152 Encounter for screening for COVID-19: Secondary | ICD-10-CM | POA: Diagnosis not present

## 2022-10-29 DIAGNOSIS — H6123 Impacted cerumen, bilateral: Secondary | ICD-10-CM | POA: Diagnosis not present

## 2022-10-29 DIAGNOSIS — F3340 Major depressive disorder, recurrent, in remission, unspecified: Secondary | ICD-10-CM | POA: Diagnosis not present

## 2022-10-29 DIAGNOSIS — Z0000 Encounter for general adult medical examination without abnormal findings: Secondary | ICD-10-CM | POA: Diagnosis not present

## 2022-10-29 DIAGNOSIS — E785 Hyperlipidemia, unspecified: Secondary | ICD-10-CM | POA: Diagnosis not present

## 2022-10-29 DIAGNOSIS — Z23 Encounter for immunization: Secondary | ICD-10-CM | POA: Diagnosis not present

## 2022-10-29 DIAGNOSIS — M810 Age-related osteoporosis without current pathological fracture: Secondary | ICD-10-CM | POA: Diagnosis not present

## 2022-10-29 DIAGNOSIS — Z1339 Encounter for screening examination for other mental health and behavioral disorders: Secondary | ICD-10-CM | POA: Diagnosis not present

## 2022-10-29 DIAGNOSIS — J069 Acute upper respiratory infection, unspecified: Secondary | ICD-10-CM | POA: Diagnosis not present

## 2022-11-02 ENCOUNTER — Ambulatory Visit: Payer: Medicare HMO | Admitting: Podiatry

## 2022-11-02 ENCOUNTER — Ambulatory Visit (INDEPENDENT_AMBULATORY_CARE_PROVIDER_SITE_OTHER): Payer: Medicare HMO

## 2022-11-02 ENCOUNTER — Encounter: Payer: Self-pay | Admitting: Podiatry

## 2022-11-02 DIAGNOSIS — M79671 Pain in right foot: Secondary | ICD-10-CM | POA: Diagnosis not present

## 2022-11-02 DIAGNOSIS — M810 Age-related osteoporosis without current pathological fracture: Secondary | ICD-10-CM | POA: Diagnosis not present

## 2022-11-02 DIAGNOSIS — M84374A Stress fracture, right foot, initial encounter for fracture: Secondary | ICD-10-CM

## 2022-11-02 NOTE — Progress Notes (Signed)
  Subjective:  Patient ID: Alicia Bradley, female    DOB: 1941/06/02,   MRN: [id number]  Chief Complaint  Patient presents with   arch pain    Arch pain in the right foot , patient states this started last night. Patient states she has had a hx of stress fractures    82 y.o. female presents for concern of right foot arch pain that started last night. She does have a history of stress fractures. Relates painful to walk on. Denies any current treatments and injury. She relates she did try tylenol last night but no improvement in pain. She is schedule for dexa scan next week and been on fosamax in the past.   . Denies any other pedal complaints. Denies n/v/f/c.   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   GERD (gastroesophageal reflux disease)    Irritable bowel syndrome    Nocturia    OA (osteoarthritis)    Osteoporosis    Presence of pessary    mangaged by gyn-- dr Radene Knee   Presence of surgical incision    03-28-2021   per pt had benign cyst removed from right arm above wrist 03-10-2021 still healing   Urgency incontinence    urologist-- dr winter    Objective:  Physical Exam: Vascular: DP/PT pulses 2/4 bilateral. CFT <3 seconds. Normal hair growth on digits. No edema.  Skin. No lacerations or abrasions bilateral feet.  Musculoskeletal: MMT 5/5 bilateral lower extremities in DF, PF, Inversion and Eversion. Deceased ROM in DF of ankle joint.  Tedner to the dorsum of the third metatarsal. No pain on fourth or second metatarsals. Some pain to plantar third  MPJ. No pain with Rom of the digit.  Neurological: Sensation intact to light touch.   Assessment:   1. Stress reaction of right foot, initial encounter   2. Osteoporosis, unspecified osteoporosis type, unspecified pathological fracture presence      Plan:  Patient was evaluated and treated and all questions answered. -Xrays reviewed. No acute  fracture possible early periosteal changes noted to the third metatarsal consistent with stress reaction.  -Discussed treatement options for stress fracture; risks, alternatives, and benefits explained. -Dispensed surgical shoe. Patient to wear at all times and instructed on use -Recommend protection, rest, ice, elevation daily until symptoms improve -Rx pain med/antinflammatories as needed -Patient to return to office in 4 weeks for serial x-rays to assess healing  or sooner if condition worsens.   Lorenda Peck, DPM

## 2022-11-05 DIAGNOSIS — M810 Age-related osteoporosis without current pathological fracture: Secondary | ICD-10-CM | POA: Diagnosis not present

## 2022-11-06 ENCOUNTER — Ambulatory Visit: Payer: Medicare HMO | Admitting: Podiatry

## 2022-11-18 DIAGNOSIS — N8182 Incompetence or weakening of pubocervical tissue: Secondary | ICD-10-CM | POA: Diagnosis not present

## 2022-11-24 DIAGNOSIS — M65312 Trigger thumb, left thumb: Secondary | ICD-10-CM | POA: Diagnosis not present

## 2022-11-25 DIAGNOSIS — M810 Age-related osteoporosis without current pathological fracture: Secondary | ICD-10-CM | POA: Diagnosis not present

## 2022-11-30 ENCOUNTER — Ambulatory Visit: Payer: Medicare HMO | Admitting: Podiatry

## 2022-12-07 ENCOUNTER — Other Ambulatory Visit (HOSPITAL_COMMUNITY): Payer: Self-pay | Admitting: *Deleted

## 2022-12-07 DIAGNOSIS — N939 Abnormal uterine and vaginal bleeding, unspecified: Secondary | ICD-10-CM | POA: Diagnosis not present

## 2022-12-08 ENCOUNTER — Encounter (HOSPITAL_COMMUNITY): Payer: Medicare HMO

## 2022-12-16 ENCOUNTER — Ambulatory Visit (HOSPITAL_COMMUNITY)
Admission: RE | Admit: 2022-12-16 | Discharge: 2022-12-16 | Disposition: A | Discharge status: Home / Self Care | Payer: Medicare HMO | Source: Ambulatory Visit | Attending: Internal Medicine | Admitting: Internal Medicine

## 2022-12-16 DIAGNOSIS — M810 Age-related osteoporosis without current pathological fracture: Secondary | ICD-10-CM | POA: Diagnosis not present

## 2022-12-16 MED ORDER — DENOSUMAB 60 MG/ML ~~LOC~~ SOSY
60.0000 mg | PREFILLED_SYRINGE | Freq: Once | SUBCUTANEOUS | Status: AC
  Administered 2022-12-16: 60 mg via SUBCUTANEOUS

## 2022-12-16 MED ORDER — DENOSUMAB 60 MG/ML ~~LOC~~ SOSY
PREFILLED_SYRINGE | SUBCUTANEOUS | Status: AC
  Filled 2022-12-16: qty 1

## 2022-12-17 DIAGNOSIS — H93292 Other abnormal auditory perceptions, left ear: Secondary | ICD-10-CM | POA: Diagnosis not present

## 2022-12-17 DIAGNOSIS — H6992 Unspecified Eustachian tube disorder, left ear: Secondary | ICD-10-CM | POA: Diagnosis not present

## 2022-12-24 DIAGNOSIS — M19071 Primary osteoarthritis, right ankle and foot: Secondary | ICD-10-CM | POA: Diagnosis not present

## 2023-01-01 DIAGNOSIS — M79652 Pain in left thigh: Secondary | ICD-10-CM | POA: Diagnosis not present

## 2023-01-01 DIAGNOSIS — M25552 Pain in left hip: Secondary | ICD-10-CM | POA: Diagnosis not present

## 2023-01-06 DIAGNOSIS — L821 Other seborrheic keratosis: Secondary | ICD-10-CM | POA: Diagnosis not present

## 2023-01-06 DIAGNOSIS — L720 Epidermal cyst: Secondary | ICD-10-CM | POA: Diagnosis not present

## 2023-01-06 DIAGNOSIS — L570 Actinic keratosis: Secondary | ICD-10-CM | POA: Diagnosis not present

## 2023-01-06 DIAGNOSIS — Z85828 Personal history of other malignant neoplasm of skin: Secondary | ICD-10-CM | POA: Diagnosis not present

## 2023-01-06 DIAGNOSIS — L814 Other melanin hyperpigmentation: Secondary | ICD-10-CM | POA: Diagnosis not present

## 2023-01-06 DIAGNOSIS — C44529 Squamous cell carcinoma of skin of other part of trunk: Secondary | ICD-10-CM | POA: Diagnosis not present

## 2023-01-06 DIAGNOSIS — D1801 Hemangioma of skin and subcutaneous tissue: Secondary | ICD-10-CM | POA: Diagnosis not present

## 2023-01-13 DIAGNOSIS — N8182 Incompetence or weakening of pubocervical tissue: Secondary | ICD-10-CM | POA: Diagnosis not present

## 2023-01-25 DIAGNOSIS — N390 Urinary tract infection, site not specified: Secondary | ICD-10-CM | POA: Diagnosis not present

## 2023-01-27 DIAGNOSIS — N950 Postmenopausal bleeding: Secondary | ICD-10-CM | POA: Diagnosis not present

## 2023-01-28 DIAGNOSIS — N950 Postmenopausal bleeding: Secondary | ICD-10-CM | POA: Diagnosis not present

## 2023-02-04 DIAGNOSIS — L720 Epidermal cyst: Secondary | ICD-10-CM | POA: Diagnosis not present

## 2023-02-04 DIAGNOSIS — C44629 Squamous cell carcinoma of skin of left upper limb, including shoulder: Secondary | ICD-10-CM | POA: Diagnosis not present

## 2023-02-08 DIAGNOSIS — N390 Urinary tract infection, site not specified: Secondary | ICD-10-CM | POA: Diagnosis not present

## 2023-02-10 DIAGNOSIS — N8182 Incompetence or weakening of pubocervical tissue: Secondary | ICD-10-CM | POA: Diagnosis not present

## 2023-03-15 DIAGNOSIS — L720 Epidermal cyst: Secondary | ICD-10-CM | POA: Diagnosis not present

## 2023-03-16 DIAGNOSIS — N939 Abnormal uterine and vaginal bleeding, unspecified: Secondary | ICD-10-CM | POA: Diagnosis not present

## 2023-03-23 DIAGNOSIS — N390 Urinary tract infection, site not specified: Secondary | ICD-10-CM | POA: Diagnosis not present

## 2023-03-25 ENCOUNTER — Other Ambulatory Visit: Payer: Self-pay | Admitting: Internal Medicine

## 2023-03-25 DIAGNOSIS — Z1231 Encounter for screening mammogram for malignant neoplasm of breast: Secondary | ICD-10-CM

## 2023-03-26 DIAGNOSIS — M19071 Primary osteoarthritis, right ankle and foot: Secondary | ICD-10-CM | POA: Diagnosis not present

## 2023-04-02 DIAGNOSIS — H524 Presbyopia: Secondary | ICD-10-CM | POA: Diagnosis not present

## 2023-04-02 DIAGNOSIS — H02035 Senile entropion of left lower eyelid: Secondary | ICD-10-CM | POA: Diagnosis not present

## 2023-04-02 DIAGNOSIS — H40023 Open angle with borderline findings, high risk, bilateral: Secondary | ICD-10-CM | POA: Diagnosis not present

## 2023-04-02 DIAGNOSIS — H52223 Regular astigmatism, bilateral: Secondary | ICD-10-CM | POA: Diagnosis not present

## 2023-04-02 DIAGNOSIS — H02132 Senile ectropion of right lower eyelid: Secondary | ICD-10-CM | POA: Diagnosis not present

## 2023-04-02 DIAGNOSIS — H04123 Dry eye syndrome of bilateral lacrimal glands: Secondary | ICD-10-CM | POA: Diagnosis not present

## 2023-04-07 DIAGNOSIS — N950 Postmenopausal bleeding: Secondary | ICD-10-CM | POA: Diagnosis not present

## 2023-05-14 DIAGNOSIS — M25552 Pain in left hip: Secondary | ICD-10-CM | POA: Diagnosis not present

## 2023-05-17 ENCOUNTER — Ambulatory Visit: Payer: Medicare HMO

## 2023-05-17 ENCOUNTER — Ambulatory Visit: Admission: RE | Admit: 2023-05-17 | Payer: Medicare HMO | Source: Ambulatory Visit

## 2023-05-17 DIAGNOSIS — Z1231 Encounter for screening mammogram for malignant neoplasm of breast: Secondary | ICD-10-CM

## 2023-05-19 DIAGNOSIS — N8182 Incompetence or weakening of pubocervical tissue: Secondary | ICD-10-CM | POA: Diagnosis not present

## 2023-05-24 DIAGNOSIS — Z85828 Personal history of other malignant neoplasm of skin: Secondary | ICD-10-CM | POA: Diagnosis not present

## 2023-05-24 DIAGNOSIS — L910 Hypertrophic scar: Secondary | ICD-10-CM | POA: Diagnosis not present

## 2023-05-24 DIAGNOSIS — S50811A Abrasion of right forearm, initial encounter: Secondary | ICD-10-CM | POA: Diagnosis not present

## 2023-05-27 DIAGNOSIS — F419 Anxiety disorder, unspecified: Secondary | ICD-10-CM | POA: Diagnosis not present

## 2023-05-27 DIAGNOSIS — F3340 Major depressive disorder, recurrent, in remission, unspecified: Secondary | ICD-10-CM | POA: Diagnosis not present

## 2023-05-27 DIAGNOSIS — Z1331 Encounter for screening for depression: Secondary | ICD-10-CM | POA: Diagnosis not present

## 2023-06-23 ENCOUNTER — Other Ambulatory Visit (HOSPITAL_COMMUNITY): Payer: Self-pay | Admitting: *Deleted

## 2023-06-23 DIAGNOSIS — N8182 Incompetence or weakening of pubocervical tissue: Secondary | ICD-10-CM | POA: Diagnosis not present

## 2023-06-24 ENCOUNTER — Encounter (HOSPITAL_COMMUNITY)
Admission: RE | Admit: 2023-06-24 | Discharge: 2023-06-24 | Disposition: A | Discharge status: Home / Self Care | Payer: Medicare HMO | Source: Ambulatory Visit | Attending: Internal Medicine | Admitting: Internal Medicine

## 2023-06-24 DIAGNOSIS — F3340 Major depressive disorder, recurrent, in remission, unspecified: Secondary | ICD-10-CM | POA: Diagnosis not present

## 2023-06-24 DIAGNOSIS — M810 Age-related osteoporosis without current pathological fracture: Secondary | ICD-10-CM | POA: Diagnosis not present

## 2023-06-24 DIAGNOSIS — F419 Anxiety disorder, unspecified: Secondary | ICD-10-CM | POA: Diagnosis not present

## 2023-06-24 MED ORDER — DENOSUMAB 60 MG/ML ~~LOC~~ SOSY
PREFILLED_SYRINGE | SUBCUTANEOUS | Status: AC
  Filled 2023-06-24: qty 1

## 2023-06-24 MED ORDER — DENOSUMAB 60 MG/ML ~~LOC~~ SOSY
60.0000 mg | PREFILLED_SYRINGE | Freq: Once | SUBCUTANEOUS | Status: AC
  Administered 2023-06-24: 60 mg via SUBCUTANEOUS

## 2023-07-07 DIAGNOSIS — Z85828 Personal history of other malignant neoplasm of skin: Secondary | ICD-10-CM | POA: Diagnosis not present

## 2023-07-07 DIAGNOSIS — L821 Other seborrheic keratosis: Secondary | ICD-10-CM | POA: Diagnosis not present

## 2023-07-07 DIAGNOSIS — D485 Neoplasm of uncertain behavior of skin: Secondary | ICD-10-CM | POA: Diagnosis not present

## 2023-07-07 DIAGNOSIS — D045 Carcinoma in situ of skin of trunk: Secondary | ICD-10-CM | POA: Diagnosis not present

## 2023-07-07 DIAGNOSIS — L910 Hypertrophic scar: Secondary | ICD-10-CM | POA: Diagnosis not present

## 2023-07-20 DIAGNOSIS — H9012 Conductive hearing loss, unilateral, left ear, with unrestricted hearing on the contralateral side: Secondary | ICD-10-CM | POA: Diagnosis not present

## 2023-07-20 DIAGNOSIS — R0981 Nasal congestion: Secondary | ICD-10-CM | POA: Diagnosis not present

## 2023-07-20 DIAGNOSIS — H8110 Benign paroxysmal vertigo, unspecified ear: Secondary | ICD-10-CM | POA: Diagnosis not present

## 2023-07-20 DIAGNOSIS — H6122 Impacted cerumen, left ear: Secondary | ICD-10-CM | POA: Diagnosis not present

## 2023-07-28 DIAGNOSIS — N8182 Incompetence or weakening of pubocervical tissue: Secondary | ICD-10-CM | POA: Diagnosis not present

## 2023-07-29 DIAGNOSIS — M25552 Pain in left hip: Secondary | ICD-10-CM | POA: Diagnosis not present

## 2023-09-07 DIAGNOSIS — N8182 Incompetence or weakening of pubocervical tissue: Secondary | ICD-10-CM | POA: Diagnosis not present

## 2023-10-05 DIAGNOSIS — N8182 Incompetence or weakening of pubocervical tissue: Secondary | ICD-10-CM | POA: Diagnosis not present

## 2023-10-08 DIAGNOSIS — M19071 Primary osteoarthritis, right ankle and foot: Secondary | ICD-10-CM | POA: Diagnosis not present

## 2023-10-11 DIAGNOSIS — R351 Nocturia: Secondary | ICD-10-CM | POA: Diagnosis not present

## 2023-10-11 DIAGNOSIS — N3941 Urge incontinence: Secondary | ICD-10-CM | POA: Diagnosis not present

## 2023-10-28 DIAGNOSIS — L821 Other seborrheic keratosis: Secondary | ICD-10-CM | POA: Diagnosis not present

## 2023-10-28 DIAGNOSIS — Z85828 Personal history of other malignant neoplasm of skin: Secondary | ICD-10-CM | POA: Diagnosis not present

## 2023-11-01 DIAGNOSIS — E785 Hyperlipidemia, unspecified: Secondary | ICD-10-CM | POA: Diagnosis not present

## 2023-11-01 DIAGNOSIS — F419 Anxiety disorder, unspecified: Secondary | ICD-10-CM | POA: Diagnosis not present

## 2023-11-01 DIAGNOSIS — Z79899 Other long term (current) drug therapy: Secondary | ICD-10-CM | POA: Diagnosis not present

## 2023-11-01 DIAGNOSIS — F3340 Major depressive disorder, recurrent, in remission, unspecified: Secondary | ICD-10-CM | POA: Diagnosis not present

## 2023-11-02 DIAGNOSIS — N8182 Incompetence or weakening of pubocervical tissue: Secondary | ICD-10-CM | POA: Diagnosis not present

## 2023-11-03 DIAGNOSIS — M25562 Pain in left knee: Secondary | ICD-10-CM | POA: Diagnosis not present

## 2023-11-03 DIAGNOSIS — M79642 Pain in left hand: Secondary | ICD-10-CM | POA: Diagnosis not present

## 2023-11-03 DIAGNOSIS — M25512 Pain in left shoulder: Secondary | ICD-10-CM | POA: Diagnosis not present

## 2023-11-04 ENCOUNTER — Emergency Department (HOSPITAL_BASED_OUTPATIENT_CLINIC_OR_DEPARTMENT_OTHER)
Admission: EM | Admit: 2023-11-04 | Discharge: 2023-11-04 | Discharge status: Against Medical Advice | Payer: Medicare HMO | Attending: Emergency Medicine | Admitting: Emergency Medicine

## 2023-11-04 ENCOUNTER — Encounter (HOSPITAL_BASED_OUTPATIENT_CLINIC_OR_DEPARTMENT_OTHER): Payer: Self-pay | Admitting: Emergency Medicine

## 2023-11-04 ENCOUNTER — Emergency Department (HOSPITAL_BASED_OUTPATIENT_CLINIC_OR_DEPARTMENT_OTHER): Payer: Medicare HMO

## 2023-11-04 ENCOUNTER — Other Ambulatory Visit: Payer: Self-pay

## 2023-11-04 DIAGNOSIS — I700 Atherosclerosis of aorta: Secondary | ICD-10-CM | POA: Diagnosis not present

## 2023-11-04 DIAGNOSIS — Z5321 Procedure and treatment not carried out due to patient leaving prior to being seen by health care provider: Secondary | ICD-10-CM | POA: Diagnosis not present

## 2023-11-04 DIAGNOSIS — Z043 Encounter for examination and observation following other accident: Secondary | ICD-10-CM | POA: Diagnosis not present

## 2023-11-04 DIAGNOSIS — W01198A Fall on same level from slipping, tripping and stumbling with subsequent striking against other object, initial encounter: Secondary | ICD-10-CM | POA: Insufficient documentation

## 2023-11-04 DIAGNOSIS — S8992XA Unspecified injury of left lower leg, initial encounter: Secondary | ICD-10-CM | POA: Diagnosis not present

## 2023-11-04 DIAGNOSIS — S0003XA Contusion of scalp, initial encounter: Secondary | ICD-10-CM | POA: Insufficient documentation

## 2023-11-04 DIAGNOSIS — M5030 Other cervical disc degeneration, unspecified cervical region: Secondary | ICD-10-CM | POA: Diagnosis not present

## 2023-11-04 DIAGNOSIS — M419 Scoliosis, unspecified: Secondary | ICD-10-CM | POA: Diagnosis not present

## 2023-11-04 NOTE — ED Triage Notes (Signed)
 Pt to ED from home c/o mechanical fall yesterday after stepping off curb.  Hit head on pavement but denies LOC.  Was seen at [facility] and had scans done of injured left extremities but unable to have CT head done.  Pt denies visual changes, denies head pain, denies blood thinners.

## 2023-11-08 DIAGNOSIS — R82998 Other abnormal findings in urine: Secondary | ICD-10-CM | POA: Diagnosis not present

## 2023-11-15 DIAGNOSIS — R351 Nocturia: Secondary | ICD-10-CM | POA: Diagnosis not present

## 2023-11-15 DIAGNOSIS — N3941 Urge incontinence: Secondary | ICD-10-CM | POA: Diagnosis not present

## 2023-11-17 DIAGNOSIS — M7062 Trochanteric bursitis, left hip: Secondary | ICD-10-CM | POA: Diagnosis not present

## 2023-11-23 NOTE — Therapy (Signed)
 OUTPATIENT PHYSICAL THERAPY CERVICAL EVALUATION   Patient Name: Alicia Bradley MRN: [id number] DOB:31-Jan-1941, 83 y.o., female Today's Date: 11/23/2023  END OF SESSION:   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   GERD (gastroesophageal reflux disease)    Irritable bowel syndrome    Nocturia    OA (osteoarthritis)    Osteoporosis    Presence of pessary    mangaged by gyn-- dr Arelia Sneddon   Presence of surgical incision    03-28-2021   per pt had benign cyst removed from right arm above wrist 03-10-2021 still healing   Urgency incontinence    urologist-- dr winter   Past Surgical History:  Procedure Laterality Date   BLADDER SUSPENSION  2001   transvaginal sling and cystocele repair   BLEPHAROPLASTY W/ LASER Bilateral 12/2013   upper eyelids   BOTOX INJECTION N/A 04/02/2021   Procedure: BOTOX INJECTION WITH CYSTOSCOPY, 100 UNITS;  Surgeon: Rene Paci, MD;  Location: [facility];  Service: Urology;  Laterality: N/A;  ONLY NEEDS 30 MIN   CARPAL TUNNEL RELEASE Right 04/2009   AND RIGHT CUBITAL TUNNEL RELEASE   CATARACT EXTRACTION W/ INTRAOCULAR LENS IMPLANT Bilateral 2006   CESAREAN SECTION     x 2  last one 1972   COLONOSCOPY     last one 2010 approx.   CYSTOCELE REPAIR  2005   and vaginal correction   DILATION AND CURETTAGE OF UTERUS  1967   EXCISION/RELEASE BURSA HIP  11/25/2011   Procedure: EXCISION/RELEASE BURSA HIP;  Surgeon: Loanne Drilling, MD;  Location: [facility];  Service: Orthopedics;  Laterality: Right;  Right Hip Bursectomy with Possible Tendon Repair   FINGER ARTHRODESIS Right 04/2016   right index finger joint   TONSILLECTOMY     child   Patient Active Problem List   Diagnosis Date Noted   Pain of left hip joint 04/05/2020   Throat discomfort 02/29/2020   Eustachian tube dysfunction, left 01/29/2020   Impacted cerumen of right ear 01/29/2020    Arthritis 10/26/2018   Irritable bowel syndrome 10/26/2018   Osteoporosis 10/26/2018   Urinary tract infectious disease 10/26/2018   Carpal tunnel syndrome on left 04/07/2016   Pain in finger of right hand 04/07/2016   Pain in soft tissues of limb 02/29/2016   Primary localized osteoarthrosis, hand 02/25/2016   Dermatochalasis of both upper eyelids 01/12/2014   Peripheral visual field defect 01/12/2014   Droopy eyelid 11/27/2013   Dry eye 11/27/2013   Glaucoma suspect of both eyes 11/27/2013   Other specified hypertrophic and atrophic condition of skin 11/27/2013   VFD (visual field defect) 11/27/2013   Trochanteric bursitis 11/25/2011   Congenital pancreatic cyst 08/11/2011    PCP: Cleatis Polka., MD  REFERRING PROVIDER: Cleatis Polka., MD  REFERRING DIAG: M54.2 (ICD-10-CM) - Cervicalgia  THERAPY DIAG:  No diagnosis found.  Rationale for Evaluation and Treatment: Rehabilitation  ONSET DATE: ***  SUBJECTIVE:  SUBJECTIVE STATEMENT: *** Hand dominance: {MISC; OT HAND DOMINANCE:[phone]}  PERTINENT HISTORY:  Cervical stenosis; OS; osteoporosis; pessary;   PAIN:  Are you having pain? Yes: NPRS scale: *** Pain location: *** Pain description: *** Aggravating factors: *** Relieving factors: ***  PRECAUTIONS: {Therapy precautions:24002}  RED FLAGS: {PT Red Flags:29287}     WEIGHT BEARING RESTRICTIONS: {Yes ***/No:24003}  FALLS:  Has patient fallen in last 6 months? {fallsyesno:27318}  LIVING ENVIRONMENT: Lives with: {[facility] lives with:25569::"lives with their family"} Lives in: {Lives in:25570} Stairs: {opstairs:27293} Has following equipment at home: {Assistive devices:23999}  OCCUPATION: ***  PLOF: {PLOF:24004}  PATIENT GOALS: ***  NEXT MD VISIT:  ***  OBJECTIVE:  Note: Objective measures were completed at Evaluation unless otherwise noted.  DIAGNOSTIC FINDINGS:  ***  PATIENT SURVEYS:  {rehab surveys:24030}  COGNITION: Overall cognitive status: {cognition:24006}  SENSATION: {sensation:27233}  POSTURE: {posture:25561}  PALPATION: ***   CERVICAL ROM:   {AROM/PROM:27142} ROM A/PROM (deg) eval  Flexion   Extension   Right lateral flexion   Left lateral flexion   Right rotation   Left rotation    (Blank rows = not tested)  UPPER EXTREMITY ROM:  {AROM/PROM:27142} ROM Right eval Left eval  Shoulder flexion    Shoulder extension    Shoulder abduction    Shoulder adduction    Shoulder extension    Shoulder internal rotation    Shoulder external rotation    Elbow flexion    Elbow extension    Wrist flexion    Wrist extension    Wrist ulnar deviation    Wrist radial deviation    Wrist pronation    Wrist supination     (Blank rows = not tested)  UPPER EXTREMITY MMT:  MMT Right eval Left eval  Shoulder flexion    Shoulder extension    Shoulder abduction    Shoulder adduction    Shoulder extension    Shoulder internal rotation    Shoulder external rotation    Middle trapezius    Lower trapezius    Elbow flexion    Elbow extension    Wrist flexion    Wrist extension    Wrist ulnar deviation    Wrist radial deviation    Wrist pronation    Wrist supination    Grip strength     (Blank rows = not tested)  CERVICAL SPECIAL TESTS:  {Cervical special tests:25246}  FUNCTIONAL TESTS:  {Functional tests:24029}  TREATMENT DATE: ***                                                                                                                                 PATIENT EDUCATION:  Education details: *** Person educated: {Person educated:25204} Education method: {Education Method:25205} Education comprehension: {Education Comprehension:25206}  HOME EXERCISE  PROGRAM: ***  ASSESSMENT:  CLINICAL IMPRESSION: Patient is a *** y.o. *** who was seen today for physical therapy evaluation and treatment for ***.   OBJECTIVE IMPAIRMENTS: {opptimpairments:25111}.  ACTIVITY LIMITATIONS: {activitylimitations:27494}  PARTICIPATION LIMITATIONS: {participationrestrictions:25113}  PERSONAL FACTORS: {Personal factors:25162} are also affecting patient's functional outcome.   REHAB POTENTIAL: {rehabpotential:25112}  CLINICAL DECISION MAKING: {clinical decision making:25114}  EVALUATION COMPLEXITY: {Evaluation complexity:25115}   GOALS: Goals reviewed with patient? {yes/no:20286}  SHORT TERM GOALS: Target date: ***  *** Baseline:  Goal status: INITIAL  2.  *** Baseline:  Goal status: INITIAL  3.  *** Baseline:  Goal status: INITIAL  4.  *** Baseline:  Goal status: INITIAL  5.  *** Baseline:  Goal status: INITIAL  6.  *** Baseline:  Goal status: INITIAL  LONG TERM GOALS: Target date: ***  *** Baseline:  Goal status: INITIAL  2.  *** Baseline:  Goal status: INITIAL  3.  *** Baseline:  Goal status: INITIAL  4.  *** Baseline:  Goal status: INITIAL  5.  *** Baseline:  Goal status: INITIAL  6.  *** Baseline:  Goal status: INITIAL   PLAN:  PT FREQUENCY: {rehab frequency:25116}  PT DURATION: {rehab duration:25117}  PLANNED INTERVENTIONS: {rehab planned interventions:25118::"97110-Therapeutic exercises","97530- Therapeutic [phone]- Neuromuscular re-education","97535- Self WUXL","24401- Manual therapy"}  PLAN FOR NEXT SESSION: Claude Manges, PT 11/23/2023, 6:28 PM

## 2023-11-24 ENCOUNTER — Other Ambulatory Visit: Payer: Self-pay

## 2023-11-24 ENCOUNTER — Encounter: Payer: Self-pay | Admitting: Physical Therapy

## 2023-11-24 ENCOUNTER — Ambulatory Visit: Payer: Medicare HMO | Attending: Internal Medicine | Admitting: Physical Therapy

## 2023-11-24 DIAGNOSIS — R2689 Other abnormalities of gait and mobility: Secondary | ICD-10-CM | POA: Diagnosis not present

## 2023-11-24 DIAGNOSIS — R2681 Unsteadiness on feet: Secondary | ICD-10-CM | POA: Insufficient documentation

## 2023-11-24 DIAGNOSIS — R293 Abnormal posture: Secondary | ICD-10-CM | POA: Diagnosis not present

## 2023-11-24 DIAGNOSIS — M6281 Muscle weakness (generalized): Secondary | ICD-10-CM | POA: Insufficient documentation

## 2023-11-24 DIAGNOSIS — M542 Cervicalgia: Secondary | ICD-10-CM | POA: Insufficient documentation

## 2023-11-26 DIAGNOSIS — M79642 Pain in left hand: Secondary | ICD-10-CM | POA: Diagnosis not present

## 2023-11-26 DIAGNOSIS — M25562 Pain in left knee: Secondary | ICD-10-CM | POA: Diagnosis not present

## 2023-11-29 ENCOUNTER — Encounter: Payer: Self-pay | Admitting: Physical Therapy

## 2023-11-29 ENCOUNTER — Ambulatory Visit: Payer: Medicare HMO | Attending: Internal Medicine | Admitting: Physical Therapy

## 2023-11-29 DIAGNOSIS — R293 Abnormal posture: Secondary | ICD-10-CM | POA: Insufficient documentation

## 2023-11-29 DIAGNOSIS — R2681 Unsteadiness on feet: Secondary | ICD-10-CM | POA: Insufficient documentation

## 2023-11-29 DIAGNOSIS — M542 Cervicalgia: Secondary | ICD-10-CM | POA: Insufficient documentation

## 2023-11-29 DIAGNOSIS — R2689 Other abnormalities of gait and mobility: Secondary | ICD-10-CM | POA: Insufficient documentation

## 2023-11-29 DIAGNOSIS — M6281 Muscle weakness (generalized): Secondary | ICD-10-CM | POA: Diagnosis not present

## 2023-11-29 NOTE — Therapy (Signed)
 OUTPATIENT PHYSICAL THERAPY CERVICAL / BALANCE TREATMENT   Patient Name: KINSLIE HOVE MRN: [id number] DOB:26-Dec-1940, 83 y.o., female Today's Date: 11/29/2023  END OF SESSION:  PT End of Session - 11/29/23 1537     Visit Number 2    Date for PT Re-Evaluation 01/19/24    Authorization Type [employer]/ Medicare    Progress Note Due on Visit 10    PT Start Time 1448    PT Stop Time 1532    PT Time Calculation (min) 44 min    Activity Tolerance Patient tolerated treatment well    Behavior During Therapy [facility] for tasks assessed/performed              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   GERD (gastroesophageal reflux disease)    Irritable bowel syndrome    Nocturia    OA (osteoarthritis)    Osteoporosis    Presence of pessary    mangaged by gyn-- dr Arelia Sneddon   Presence of surgical incision    03-28-2021   per pt had benign cyst removed from right arm above wrist 03-10-2021 still healing   Urgency incontinence    urologist-- dr winter   Past Surgical History:  Procedure Laterality Date   BLADDER SUSPENSION  2001   transvaginal sling and cystocele repair   BLEPHAROPLASTY W/ LASER Bilateral 12/2013   upper eyelids   BOTOX INJECTION N/A 04/02/2021   Procedure: BOTOX INJECTION WITH CYSTOSCOPY, 100 UNITS;  Surgeon: Rene Paci, MD;  Location: [facility];  Service: Urology;  Laterality: N/A;  ONLY NEEDS 30 MIN   CARPAL TUNNEL RELEASE Right 04/2009   AND RIGHT CUBITAL TUNNEL RELEASE   CATARACT EXTRACTION W/ INTRAOCULAR LENS IMPLANT Bilateral 2006   CESAREAN SECTION     x 2  last one 1972   COLONOSCOPY     last one 2010 approx.   CYSTOCELE REPAIR  2005   and vaginal correction   DILATION AND CURETTAGE OF UTERUS  1967   EXCISION/RELEASE BURSA HIP  11/25/2011   Procedure: EXCISION/RELEASE BURSA HIP;  Surgeon: Loanne Drilling, MD;  Location: [facility];  Service:  Orthopedics;  Laterality: Right;  Right Hip Bursectomy with Possible Tendon Repair   FINGER ARTHRODESIS Right 04/2016   right index finger joint   TONSILLECTOMY     child   Patient Active Problem List   Diagnosis Date Noted   Pain of left hip joint 04/05/2020   Throat discomfort 02/29/2020   Eustachian tube dysfunction, left 01/29/2020   Impacted cerumen of right ear 01/29/2020   Arthritis 10/26/2018   Irritable bowel syndrome 10/26/2018   Osteoporosis 10/26/2018   Urinary tract infectious disease 10/26/2018   Carpal tunnel syndrome on left 04/07/2016   Pain in finger of right hand 04/07/2016   Pain in soft tissues of limb 02/29/2016   Primary localized osteoarthrosis, hand 02/25/2016   Dermatochalasis of both upper eyelids 01/12/2014   Peripheral visual field defect 01/12/2014   Droopy eyelid 11/27/2013   Dry eye 11/27/2013   Glaucoma suspect of both eyes 11/27/2013   Other specified hypertrophic and atrophic condition of skin 11/27/2013   VFD (visual field defect) 11/27/2013   Trochanteric bursitis 11/25/2011   Congenital pancreatic cyst 08/11/2011    PCP: Cleatis Polka., MD  REFERRING PROVIDER: Cleatis Polka., MD  REFERRING DIAG: M54.2 (ICD-10-CM) - Cervicalgia  THERAPY DIAG:  Unsteadiness on feet  Other abnormalities of gait and mobility  Cervicalgia  Muscle weakness (generalized)  Abnormal posture  Rationale for Evaluation and Treatment: Rehabilitation  ONSET DATE: Several Years  SUBJECTIVE:                                                                                                                                                                                                         SUBJECTIVE STATEMENT: Patient reports when bending down repetitively she gets very dizzy. She has been compliant with HEP.  From Eval: Patient report her equilibrium feels off.  She had a fall three weeks ago and had Xrays done and they came back unremarkable  besides a hematoma.  See imaging results below. She feels the fluid is not draining from her Lt ear. She feels her left ear canal is different from her right. She does nose drops and she notices the difference in her ear canals. With every motion she feels off balance (sit to stand transfer, walking, supine to sit transfer). She only has neck pain when she turns a certain way (usually right cervical rotation). She would like to address balance and cervical pain. Hand dominance: Right  PERTINENT HISTORY:  Cervical stenosis; OA; osteoporosis; pessary;   PAIN:  Are you having pain?  She occasionally has right sided neck pain when she turns a certain way. Usually with cervical rotation. She feels a catch.  PRECAUTIONS: None  RED FLAGS: None     WEIGHT BEARING RESTRICTIONS: No  FALLS:  Has patient fallen in last 6 months? Yes. Number of falls 1 three weeks ago she fell while walking out of a store. She fell down a curb. 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  DIAGNOSTIC FINDINGS:  Head CT 11/04/2023 IMPRESSION: 1. No acute traumatic injury identified in the cervical spine. 2. 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 3.  Aortic Atherosclerosis (ICD10-I70.0)  IMPRESSION: 1. Left forehead, anterior convexity scalp hematoma. No underlying skull fracture. 2.  No acute intracranial  abnormality. Mild for age white matter changes most commonly due to small vessel disease. PATIENT SURVEYS:  ABC scale 72.5 % moderate level of physical functioning  COGNITION: Overall cognitive status: Within functional limits for tasks assessed  SENSATION: WFL  POSTURE: rounded shoulders, forward head, and increased thoracic kyphosis    CERVICAL ROM:   Active ROM A/PROM (deg) eval  Flexion 30  Extension 27  Right lateral flexion 30  Left lateral flexion 25  Right rotation 40  Left rotation 40   (Blank rows = not tested)    LOWER EXTREMITY ZOX:[id number] hip flexion 4-/5 other muscles grossly 4/5     FUNCTIONAL TESTS:  5 times sit to stand: 19.63 hands on thighs Timed up and go (TUG): 14.16 sec; decreased speed with turns MCTSIB: Condition 1: Avg of 3 trials: 16.09 sec, Condition 2: Avg of 3 trials: 8.59sec sec, Condition 3: Avg of 3 trials: 30 sec, Condition 4: Avg of 3 trials: 4.32 sec, and Total Score: 59/120 11/29/2023  DGI:16/24  TREATMENT DATE:  11/29/2023 NuStep Level 5 5 mins- PT present to discuss status DGI 16/24 Education on results of DGI;  Standing marching 2 x 10 Sit to stand x 10 Shoulder horizontal abduction with red TB x 10 Shoulder extension with red TB x 10 Shoulder row with red TB x 10 Airex step ups x 10 bilateral  Airex lateral step ups x 10 Hand hold at counter diagonal head turns x 3 each direction & looking up and down x 3 (very challenging)  Y's at wall x 10 (patient got dizzy) Manual: STM to bilateral upper traps for improved tissue mobility.  11/24/2023 Initial Evaluation & HEP created                                                                                                                                 PATIENT EDUCATION:  Education details: POC; 3 systems involved in balance ; HEP Person educated: Patient Education method: Explanation, Demonstration, and Handouts Education comprehension: verbalized understanding, returned  demonstration, and needs further education  HOME EXERCISE PROGRAM: Access Code: [email or web address] URL: [email or web address] Date: 11/24/2023 Prepared by: Claude Manges  Exercises - Standing March with Counter Support  - 1 x daily - 7 x weekly - 2 sets - 10 reps - Standing Heel Raise with Support  - 1 x daily - 7 x weekly - 2 sets - 10 reps - Sit to Stand  - 1 x daily - 7 x weekly - 1 sets - 10 reps - Shoulder External Rotation and Scapular Retraction with Resistance  - 1 x daily - 7 x weekly - 2 sets - 10 reps - Standing Shoulder Horizontal Abduction with Resistance  - 1 x daily - 7 x weekly - 2 sets - 10 reps - Shoulder extension with resistance - Neutral  - 1 x daily - 7 x weekly - 2 sets - 10 reps - Standing  Shoulder Row with Anchored Resistance  - 1 x daily - 7 x weekly - 2 sets - 10 reps  ASSESSMENT:  CLINICAL IMPRESSION: Fern verbalized compliance with HEP. She verbalized increased dizziness when performing repetitive bending activities. Administered DGI and patient scored 16/24 which indicated an increased risk of falls. Exercises requiring patient to turn her head and turn quickly were the most challenging for patient. Educated patient and provided visual feedback for vestibular system and where eustachian tube is located. Patient verbalized occasional dizziness during treatment session. Patient will benefit from skilled PT to address the below impairments and improve overall function.    OBJECTIVE IMPAIRMENTS: Abnormal gait, decreased balance, decreased strength, impaired flexibility, and postural dysfunction.   ACTIVITY LIMITATIONS: lifting, bending, squatting, transfers, bed mobility, and locomotion level  PARTICIPATION LIMITATIONS: cleaning, laundry, driving, shopping, and community activity  PERSONAL FACTORS: Age, Time since onset of injury/illness/exacerbation, and 1-2 comorbidities: osteoporosis  are also affecting patient's functional outcome.   REHAB POTENTIAL:  Good  CLINICAL DECISION MAKING: Stable/uncomplicated  EVALUATION COMPLEXITY: Low   GOALS: Goals reviewed with patient? Yes  SHORT TERM GOALS: Target date: 12/22/2023  Patient will be independent with initial HEP. Baseline:  Goal status: INITIAL  2.  Patient will report > or = to 30% improvement in balance since starting PT. Baseline:  Goal status: INITIAL  3.  Patient will demonstrate improved postural awareness while performing functional activities.  Baseline:  Goal status: INITIAL  4.  Patient will score < or = to 16 sec on 5 STS for improved functional mobility. Baseline: 19.63 sec Goal status: INITIAL   LONG TERM GOALS: Target date: 01/19/2024  Patient will demonstrate independence in advanced HEP. Baseline:  Goal status: INITIAL  2.  Patient will report > or = to 60% improvement in balance since starting PT. Baseline:  Goal status: INITIAL  3.  Patient will verbalize and demonstrate self-care strategies to manage pain including tissue mobility practices and change of position. Baseline:  Goal status: INITIAL   4.  Patient will score > or = 65/120 to on MCTISB for improved static balance and use of three systems involved in balance. Baseline: 59/120 Goal status: INITIAL  5.  Patient will score < or = to 12 sec on TUG for decreased risk in falls. Baseline: 14.16 sec Goal status: INITIAL  6.  Patient will score < or = to 13 sec on 5 STS for improved functional mobility Baseline: 19.63 sec Goal status: INITIAL   PLAN:  PT FREQUENCY: 1-2x/week  PT DURATION: 8 weeks  PLANNED INTERVENTIONS: 97164- PT Re-evaluation, 97110-Therapeutic exercises, 97530- Therapeutic activity, 97112- Neuromuscular re-education, 97535- Self Care, 16109- Manual therapy, [id number]- Gait training, [phone]- Canalith repositioning, [id number]- Aquatic Therapy, 97016- Vasopneumatic device, [id number]- Ultrasound, Patient/Family education, Balance training, Stair training, Taping, Dry Needling,  Vestibular training, Cryotherapy, and Moist heat  PLAN FOR NEXT SESSION: incorporating head turns ; postural strengthening; chin tucks; strengthening laying on foam roller   Claude Manges, PT 11/29/23 3:37 PM

## 2023-11-30 DIAGNOSIS — N8182 Incompetence or weakening of pubocervical tissue: Secondary | ICD-10-CM | POA: Diagnosis not present

## 2023-12-07 ENCOUNTER — Ambulatory Visit: Payer: Medicare HMO | Admitting: Physical Therapy

## 2023-12-07 DIAGNOSIS — R293 Abnormal posture: Secondary | ICD-10-CM | POA: Diagnosis not present

## 2023-12-07 DIAGNOSIS — R2681 Unsteadiness on feet: Secondary | ICD-10-CM

## 2023-12-07 DIAGNOSIS — M6281 Muscle weakness (generalized): Secondary | ICD-10-CM | POA: Diagnosis not present

## 2023-12-07 DIAGNOSIS — M542 Cervicalgia: Secondary | ICD-10-CM

## 2023-12-07 DIAGNOSIS — R2689 Other abnormalities of gait and mobility: Secondary | ICD-10-CM | POA: Diagnosis not present

## 2023-12-07 NOTE — Therapy (Signed)
 OUTPATIENT PHYSICAL THERAPY CERVICAL / BALANCE TREATMENT   Patient Name: Alicia Bradley MRN: [id number] DOB:[birth year], 83 y.o., female Today's Date: 12/07/2023  END OF SESSION:  PT End of Session - 12/07/23 0738     Visit Number 3    Date for PT Re-Evaluation 01/19/24    Authorization Type [employer]etna/ Medicare    Progress Note Due on Visit 10    PT Start Time 0731    PT Stop Time 0800    PT Time Calculation (min) 29 min    Activity Tolerance Patient tolerated treatment well    Behavior During Therapy [facility] for tasks assessed/performed               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   GERD (gastroesophageal reflux disease)    Irritable bowel syndrome    Nocturia    OA (osteoarthritis)    Osteoporosis    Presence of pessary    mangaged by gyn-- dr Arelia Sneddon   Presence of surgical incision    03-28-2021   per pt had benign cyst removed from right arm above wrist 03-10-2021 still healing   Urgency incontinence    urologist-- dr winter   Past Surgical History:  Procedure Laterality Date   BLADDER SUSPENSION  2001   transvaginal sling and cystocele repair   BLEPHAROPLASTY W/ LASER Bilateral 12/2013   upper eyelids   BOTOX INJECTION N/A 04/02/2021   Procedure: BOTOX INJECTION WITH CYSTOSCOPY, 100 UNITS;  Surgeon: Rene Paci, MD;  Location: [facility];  Service: Urology;  Laterality: N/A;  ONLY NEEDS 30 MIN   CARPAL TUNNEL RELEASE Right 04/2009   AND RIGHT CUBITAL TUNNEL RELEASE   CATARACT EXTRACTION W/ INTRAOCULAR LENS IMPLANT Bilateral 2006   CESAREAN SECTION     x 2  last one 1972   COLONOSCOPY     last one 2010 approx.   CYSTOCELE REPAIR  2005   and vaginal correction   DILATION AND CURETTAGE OF UTERUS  1967   EXCISION/RELEASE BURSA HIP  11/25/2011   Procedure: EXCISION/RELEASE BURSA HIP;  Surgeon: Loanne Drilling, MD;  Location: [facility];  Service:  Orthopedics;  Laterality: Right;  Right Hip Bursectomy with Possible Tendon Repair   FINGER ARTHRODESIS Right 04/2016   right index finger joint   TONSILLECTOMY     child   Patient Active Problem List   Diagnosis Date Noted   Pain of left hip joint 04/05/2020   Throat discomfort 02/29/2020   Eustachian tube dysfunction, left 01/29/2020   Impacted cerumen of right ear 01/29/2020   Arthritis 10/26/2018   Irritable bowel syndrome 10/26/2018   Osteoporosis 10/26/2018   Urinary tract infectious disease 10/26/2018   Carpal tunnel syndrome on left 04/07/2016   Pain in finger of right hand 04/07/2016   Pain in soft tissues of limb 02/29/2016   Primary localized osteoarthrosis, hand 02/25/2016   Dermatochalasis of both upper eyelids 01/12/2014   Peripheral visual field defect 01/12/2014   Droopy eyelid 11/27/2013   Dry eye 11/27/2013   Glaucoma suspect of both eyes 11/27/2013   Other specified hypertrophic and atrophic condition of skin 11/27/2013   VFD (visual field defect) 11/27/2013   Trochanteric bursitis 11/25/2011   Congenital pancreatic cyst 08/11/2011    PCP: Cleatis Polka., MD  REFERRING PROVIDER: Cleatis Polka., MD  REFERRING DIAG: M54.2 (ICD-10-CM) - Cervicalgia  THERAPY DIAG:  Unsteadiness on feet  Other abnormalities of gait and mobility  Cervicalgia  Muscle weakness (generalized)  Abnormal posture  Rationale for Evaluation and Treatment: Rehabilitation  ONSET DATE: Several Years  SUBJECTIVE:                                                                                                                                                                                                         SUBJECTIVE STATEMENT: Patient reports that she will occasionally turn her neck quickly and feel a sharp muscle pain. Dizziness is only present with quick turns recently. She has been compliant with HEP.  From Eval: Patient report her equilibrium feels off.  She  had a fall three weeks ago and had Xrays done and they came back unremarkable besides a hematoma.  See imaging results below. She feels the fluid is not draining from her Lt ear. She feels her left ear canal is different from her right. She does nose drops and she notices the difference in her ear canals. With every motion she feels off balance (sit to stand transfer, walking, supine to sit transfer). She only has neck pain when she turns a certain way (usually right cervical rotation). She would like to address balance and cervical pain. Hand dominance: Right  PERTINENT HISTORY:  Cervical stenosis; OA; osteoporosis; pessary;   PAIN:  Are you having pain?  She occasionally has right sided neck pain when she turns a certain way. Usually with cervical rotation. She feels a catch.  PRECAUTIONS: None  RED FLAGS: None     WEIGHT BEARING RESTRICTIONS: No  FALLS:  Has patient fallen in last 6 months? Yes. Number of falls 1 three weeks ago she fell while walking out of a store. She fell down a curb. 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  DIAGNOSTIC FINDINGS:  Head CT 11/04/2023 IMPRESSION: 1. No acute traumatic injury identified in the cervical spine. 2. 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 3.  Aortic Atherosclerosis (ICD10-I70.0)  IMPRESSION: 1. Left forehead, anterior convexity  scalp hematoma. No underlying skull fracture. 2. No acute intracranial abnormality. Mild for age white matter changes most commonly due to small vessel disease. PATIENT SURVEYS:  ABC scale 72.5 % moderate level of physical functioning  COGNITION: Overall cognitive status: Within functional limits for tasks assessed  SENSATION: WFL  POSTURE: rounded shoulders, forward head, and increased thoracic kyphosis    CERVICAL ROM:   Active ROM A/PROM (deg) eval  Flexion 30  Extension 27  Right lateral flexion 30  Left lateral flexion 25  Right rotation 40  Left rotation 40   (Blank rows = not tested)    LOWER EXTREMITY YQM:VHQIONGEX hip flexion 4-/5 other muscles grossly 4/5     FUNCTIONAL TESTS:  5 times sit to stand: 19.63 hands on thighs Timed up and go (TUG): 14.16 sec; decreased speed with turns MCTSIB: Condition 1: Avg of 3 trials: 16.09 sec, Condition 2: Avg of 3 trials: 8.59sec sec, Condition 3: Avg of 3 trials: 30 sec, Condition 4: Avg of 3 trials: 4.32 sec, and Total Score: 59/120 11/29/2023  DGI:16/24  TREATMENT DATE:  12/07/2023 NuStep Level 5 5 mins- PT present to discuss status Standing marching 2 x 10 (1# ankle weights) Airex + 2" step ups fwd/lat 2x10 bilateral (1# ankle weights) Sit to stand holding 3# dumbbell 2x10 - bilateral head turns added each way to last set  Shoulder horizontal abduction with red TB x 10 Shoulder extension with red TB x 10 Shoulder row with red TB x 10 Manual: STM to bilateral upper traps for improved tissue mobility.  11/29/2023 NuStep Level 5 5 mins- PT present to discuss status DGI 16/24 Education on results of DGI;  Standing marching 2 x 10 Sit to stand x 10 Shoulder horizontal abduction with red TB x 10 Shoulder extension with red TB x 10 Shoulder row with red TB x 10 Airex step ups x 10 bilateral  Airex lateral step ups x 10 Hand hold at counter diagonal head turns x 3 each direction & looking up and down x 3 (very  challenging)  Y's at wall x 10 (patient got dizzy) Manual: STM to bilateral upper traps for improved tissue mobility.  11/24/2023 Initial Evaluation & HEP created                                                                                                                               PATIENT EDUCATION:  Education details: POC; 3 systems involved in balance ; HEP Person educated: Patient Education method: Explanation, Demonstration, and Handouts Education comprehension: verbalized understanding, returned demonstration, and needs further education  HOME EXERCISE PROGRAM: Access Code: [email or web address] URL: [email or web address] Date: 11/24/2023 Prepared by: Claude Manges  Exercises - Standing March with Counter Support  - 1 x daily - 7 x weekly - 2 sets - 10 reps - Standing Heel Raise with Support  - 1 x daily - 7 x weekly - 2 sets -  10 reps - Sit to Stand  - 1 x daily - 7 x weekly - 1 sets - 10 reps - Shoulder External Rotation and Scapular Retraction with Resistance  - 1 x daily - 7 x weekly - 2 sets - 10 reps - Standing Shoulder Horizontal Abduction with Resistance  - 1 x daily - 7 x weekly - 2 sets - 10 reps - Shoulder extension with resistance - Neutral  - 1 x daily - 7 x weekly - 2 sets - 10 reps - Standing Shoulder Row with Anchored Resistance  - 1 x daily - 7 x weekly - 2 sets - 10 reps  ASSESSMENT:  CLINICAL IMPRESSION: Evonne verbalized compliance with HEP. She verbalized increased dizziness when performing repetitive bending activities and with rapid head turns. Exercises requiring patient to turn her head and turn quickly were the most challenging for patient, but we were able to increase weight and repetition count with all of former exercises today. Educated patient and provided visual feedback for vestibular system and where eustachian tube is located. Patient verbalized occasional dizziness during treatment session. Patient will benefit from skilled PT to address the  below impairments and improve overall function.  OBJECTIVE IMPAIRMENTS: Abnormal gait, decreased balance, decreased strength, impaired flexibility, and postural dysfunction.   ACTIVITY LIMITATIONS: lifting, bending, squatting, transfers, bed mobility, and locomotion level  PARTICIPATION LIMITATIONS: cleaning, laundry, driving, shopping, and community activity  PERSONAL FACTORS: Age, Time since onset of injury/illness/exacerbation, and 1-2 comorbidities: osteoporosis  are also affecting patient's functional outcome.   REHAB POTENTIAL: Good  CLINICAL DECISION MAKING: Stable/uncomplicated  EVALUATION COMPLEXITY: Low   GOALS: Goals reviewed with patient? Yes  SHORT TERM GOALS: Target date: 12/22/2023  Patient will be independent with initial HEP. Baseline:  Goal status: INITIAL  2.  Patient will report > or = to 30% improvement in balance since starting PT. Baseline:  Goal status: INITIAL  3.  Patient will demonstrate improved postural awareness while performing functional activities.  Baseline:  Goal status: INITIAL  4.  Patient will score < or = to 16 sec on 5 STS for improved functional mobility. Baseline: 19.63 sec Goal status: INITIAL   LONG TERM GOALS: Target date: 01/19/2024  Patient will demonstrate independence in advanced HEP. Baseline:  Goal status: INITIAL  2.  Patient will report > or = to 60% improvement in balance since starting PT. Baseline:  Goal status: INITIAL  3.  Patient will verbalize and demonstrate self-care strategies to manage pain including tissue mobility practices and change of position. Baseline:  Goal status: INITIAL   4.  Patient will score > or = 65/120 to on MCTISB for improved static balance and use of three systems involved in balance. Baseline: 59/120 Goal status: INITIAL  5.  Patient will score < or = to 12 sec on TUG for decreased risk in falls. Baseline: 14.16 sec Goal status: INITIAL  6.  Patient will score < or = to 13  sec on 5 STS for improved functional mobility Baseline: 19.63 sec Goal status: INITIAL   PLAN:  PT FREQUENCY: 1-2x/week  PT DURATION: 8 weeks  PLANNED INTERVENTIONS: 97164- PT Re-evaluation, 97110-Therapeutic exercises, 97530- Therapeutic activity, [id number]- Neuromuscular re-education, 97535- Self Care, 16109- Manual therapy, [id number]- Gait training, [phone]- Canalith repositioning, [id number]- Aquatic Therapy, 97016- Vasopneumatic device, [id number]- Ultrasound, Patient/Family education, Balance training, Stair training, Taping, Dry Needling, Vestibular training, Cryotherapy, and Moist heat  PLAN FOR NEXT SESSION: incorporating head turns ; postural strengthening; chin tucks; strengthening laying on foam roller  Earna Coder, PT, DPT 12/07/23 8:01 AM

## 2023-12-09 DIAGNOSIS — M79644 Pain in right finger(s): Secondary | ICD-10-CM | POA: Diagnosis not present

## 2023-12-09 DIAGNOSIS — M79645 Pain in left finger(s): Secondary | ICD-10-CM | POA: Diagnosis not present

## 2023-12-09 DIAGNOSIS — M180 Bilateral primary osteoarthritis of first carpometacarpal joints: Secondary | ICD-10-CM | POA: Diagnosis not present

## 2023-12-12 DIAGNOSIS — S76912A Strain of unspecified muscles, fascia and tendons at thigh level, left thigh, initial encounter: Secondary | ICD-10-CM | POA: Diagnosis not present

## 2023-12-14 ENCOUNTER — Ambulatory Visit: Payer: Medicare HMO | Admitting: Physical Therapy

## 2023-12-14 DIAGNOSIS — R2689 Other abnormalities of gait and mobility: Secondary | ICD-10-CM | POA: Diagnosis not present

## 2023-12-14 DIAGNOSIS — R2681 Unsteadiness on feet: Secondary | ICD-10-CM

## 2023-12-14 DIAGNOSIS — M542 Cervicalgia: Secondary | ICD-10-CM | POA: Diagnosis not present

## 2023-12-14 DIAGNOSIS — M6281 Muscle weakness (generalized): Secondary | ICD-10-CM | POA: Diagnosis not present

## 2023-12-14 DIAGNOSIS — R293 Abnormal posture: Secondary | ICD-10-CM | POA: Diagnosis not present

## 2023-12-14 NOTE — Therapy (Signed)
 OUTPATIENT PHYSICAL THERAPY CERVICAL / BALANCE TREATMENT   Patient Name: Alicia Bradley MRN: [id number] DOB:02/05/1941, 83 y.o., female Today's Date: 12/14/2023  END OF SESSION:  PT End of Session - 12/14/23 0756     Visit Number 4    Date for PT Re-Evaluation 01/19/24    Authorization Type [employer]/ Medicare    Progress Note Due on Visit 10    PT Start Time 0728    PT Stop Time 0757    PT Time Calculation (min) 29 min    Activity Tolerance Patient tolerated treatment well    Behavior During Therapy [facility] for tasks assessed/performed                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   GERD (gastroesophageal reflux disease)    Irritable bowel syndrome    Nocturia    OA (osteoarthritis)    Osteoporosis    Presence of pessary    mangaged by gyn-- dr Arelia Sneddon   Presence of surgical incision    03-28-2021   per pt had benign cyst removed from right arm above wrist 03-10-2021 still healing   Urgency incontinence    urologist-- dr winter   Past Surgical History:  Procedure Laterality Date   BLADDER SUSPENSION  2001   transvaginal sling and cystocele repair   BLEPHAROPLASTY W/ LASER Bilateral 12/2013   upper eyelids   BOTOX INJECTION N/A 04/02/2021   Procedure: BOTOX INJECTION WITH CYSTOSCOPY, 100 UNITS;  Surgeon: Rene Paci, MD;  Location: [facility];  Service: Urology;  Laterality: N/A;  ONLY NEEDS 30 MIN   CARPAL TUNNEL RELEASE Right 04/2009   AND RIGHT CUBITAL TUNNEL RELEASE   CATARACT EXTRACTION W/ INTRAOCULAR LENS IMPLANT Bilateral 2006   CESAREAN SECTION     x 2  last one 1972   COLONOSCOPY     last one 2010 approx.   CYSTOCELE REPAIR  2005   and vaginal correction   DILATION AND CURETTAGE OF UTERUS  1967   EXCISION/RELEASE BURSA HIP  11/25/2011   Procedure: EXCISION/RELEASE BURSA HIP;  Surgeon: Loanne Drilling, MD;  Location: [facility];  Service:  Orthopedics;  Laterality: Right;  Right Hip Bursectomy with Possible Tendon Repair   FINGER ARTHRODESIS Right 04/2016   right index finger joint   TONSILLECTOMY     child   Patient Active Problem List   Diagnosis Date Noted   Pain of left hip joint 04/05/2020   Throat discomfort 02/29/2020   Eustachian tube dysfunction, left 01/29/2020   Impacted cerumen of right ear 01/29/2020   Arthritis 10/26/2018   Irritable bowel syndrome 10/26/2018   Osteoporosis 10/26/2018   Urinary tract infectious disease 10/26/2018   Carpal tunnel syndrome on left 04/07/2016   Pain in finger of right hand 04/07/2016   Pain in soft tissues of limb 02/29/2016   Primary localized osteoarthrosis, hand 02/25/2016   Dermatochalasis of both upper eyelids 01/12/2014   Peripheral visual field defect 01/12/2014   Droopy eyelid 11/27/2013   Dry eye 11/27/2013   Glaucoma suspect of both eyes 11/27/2013   Other specified hypertrophic and atrophic condition of skin 11/27/2013   VFD (visual field defect) 11/27/2013   Trochanteric bursitis 11/25/2011   Congenital pancreatic cyst 08/11/2011    PCP: Cleatis Polka., MD  REFERRING PROVIDER: Cleatis Polka., MD  REFERRING DIAG: M54.2 (ICD-10-CM) -  Cervicalgia  THERAPY DIAG:  Unsteadiness on feet  Other abnormalities of gait and mobility  Cervicalgia  Rationale for Evaluation and Treatment: Rehabilitation  ONSET DATE: Several Years  SUBJECTIVE:                                                                                                                                                                                                         SUBJECTIVE STATEMENT: Patient reports that she has realized how to provoke dizziness - avoiding sharp turns. When she reaches to pick something up sometimes she will feel a sharp pain in the neck with right head turns.   From Eval: Patient report her equilibrium feels off.  She had a fall three weeks ago and had  Xrays done and they came back unremarkable besides a hematoma.  See imaging results below. She feels the fluid is not draining from her Lt ear. She feels her left ear canal is different from her right. She does nose drops and she notices the difference in her ear canals. With every motion she feels off balance (sit to stand transfer, walking, supine to sit transfer). She only has neck pain when she turns a certain way (usually right cervical rotation). She would like to address balance and cervical pain. Hand dominance: Right  PERTINENT HISTORY:  Cervical stenosis; OA; osteoporosis; pessary;   PAIN:  Are you having pain?  She occasionally has right sided neck pain when she turns a certain way. Usually with cervical rotation. She feels a catch.  PRECAUTIONS: None  RED FLAGS: None    WEIGHT BEARING RESTRICTIONS: No  FALLS:  Has patient fallen in last 6 months? Yes. Number of falls 1 three weeks ago she fell while walking out of a store. She fell down a curb. 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  DIAGNOSTIC FINDINGS:  Head CT 11/04/2023 IMPRESSION: 1. No acute traumatic injury identified in the cervical spine. 2. 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 3.  Aortic Atherosclerosis (ICD10-I70.0)  IMPRESSION: 1. Left forehead, anterior convexity scalp hematoma. No underlying skull  fracture. 2. No acute intracranial abnormality. Mild for age white matter changes most commonly due to small vessel disease. PATIENT SURVEYS:  ABC scale 72.5 % moderate level of physical functioning  COGNITION: Overall cognitive status: Within functional limits for tasks assessed  SENSATION: WFL  POSTURE: rounded shoulders, forward head, and increased thoracic kyphosis    CERVICAL ROM:   Active ROM A/PROM (deg) eval  Flexion 30  Extension 27  Right lateral flexion 30  Left lateral flexion 25  Right rotation 40  Left rotation 40   (Blank rows = not tested)    LOWER EXTREMITY ZOX:[id number] hip flexion 4-/5 other muscles grossly 4/5     FUNCTIONAL TESTS:  5 times sit to stand: 19.63 hands on thighs Timed up and go (TUG): 14.16 sec; decreased speed with turns MCTSIB: Condition 1: Avg of 3 trials: 16.09 sec, Condition 2: Avg of 3 trials: 8.59sec sec, Condition 3: Avg of 3 trials: 30 sec, Condition 4: Avg of 3 trials: 4.32 sec, and Total Score: 59/120 11/29/2023  DGI:16/24  TREATMENT DATE:  12/14/2023 Manual: STM to bilateral upper traps for improved tissue mobility. Forward/backward shoulder rolls 2x20  Scapular squeezes 2x20  shoulder row with GTB 2x10  Shoulder extension with GTB 2x10  Shoulder horizontal abduction with GTB 2x10   12/07/2023 NuStep Level 5 5 mins- PT present to discuss status Standing marching 2 x 10 (1# ankle weights) Airex + 2" step ups fwd/lat 2x10 bilateral (1# ankle weights) Sit to stand holding 3# dumbbell 2x10 - bilateral head turns added each way to last set  Shoulder horizontal abduction with red TB x 10 Shoulder extension with red TB x 10 Shoulder row with red TB x 10 Manual: STM to bilateral upper traps for improved tissue mobility.  11/29/2023 NuStep Level 5 5 mins- PT present to discuss status DGI 16/24 Education on results of DGI;  Standing marching 2 x 10 Sit to stand x 10 Shoulder horizontal abduction with red TB x  10 Shoulder extension with red TB x 10 Shoulder row with red TB x 10 Airex step ups x 10 bilateral  Airex lateral step ups x 10 Hand hold at counter diagonal head turns x 3 each direction & looking up and down x 3 (very challenging)  Y's at wall x 10 (patient got dizzy) Manual: STM to bilateral upper traps for improved tissue mobility.  11/24/2023 Initial Evaluation & HEP created                                                                                                                               PATIENT EDUCATION:  Education details: POC; 3 systems involved in balance ; HEP Person educated: Patient Education method: Explanation, Demonstration, and Handouts Education comprehension: verbalized understanding, returned demonstration, and needs further education  HOME EXERCISE PROGRAM: Access Code: [email or web address] URL: [email or web address] Date: 11/24/2023 Prepared by: Claude Manges  Exercises - Standing March with Counter Support  - 1 x daily - 7 x weekly - 2 sets - 10 reps - Standing Heel Raise with Support  - 1 x daily - 7 x weekly - 2 sets - 10 reps - Sit to Stand  - 1 x daily - 7 x weekly - 1 sets - 10 reps - Shoulder External Rotation and Scapular Retraction with Resistance  - 1 x daily - 7 x weekly - 2 sets - 10 reps - Standing Shoulder Horizontal Abduction with Resistance  - 1 x daily - 7 x weekly - 2 sets - 10 reps - Shoulder extension with resistance - Neutral  - 1 x daily - 7 x weekly - 2 sets - 10 reps - Standing Shoulder Row with Anchored Resistance  - 1 x daily - 7 x weekly - 2 sets - 10 reps  ASSESSMENT:  CLINICAL IMPRESSION: Isella verbalized compliance with HEP. She experienced no dizziness since last visit, along with no dizziness throughout today's session. Neck muscle tension has been patient's biggest complaint with increasing resistance and home and caregiving for husband. Patient's right sided upper trap was much more tense and tender to palpation than  left side. Pt tolerated an increase in all forms of resistance during today's exercises. Patient will benefit from skilled PT to address the below impairments and improve overall function.  OBJECTIVE IMPAIRMENTS: Abnormal gait, decreased balance, decreased strength, impaired flexibility, and postural dysfunction.   ACTIVITY LIMITATIONS: lifting, bending, squatting, transfers, bed mobility, and locomotion level  PARTICIPATION LIMITATIONS: cleaning, laundry, driving, shopping, and community activity  PERSONAL FACTORS: Age, Time since onset of injury/illness/exacerbation, and 1-2 comorbidities: osteoporosis  are also affecting patient's functional outcome.   REHAB POTENTIAL: Good  CLINICAL DECISION MAKING: Stable/uncomplicated  EVALUATION COMPLEXITY: Low   GOALS: Goals reviewed with patient? Yes  SHORT TERM GOALS: Target date: 12/22/2023  Patient will be independent with initial HEP. Baseline:  Goal status: INITIAL  2.  Patient will report > or = to 30% improvement in balance since starting PT. Baseline:  Goal status: INITIAL  3.  Patient will demonstrate improved postural awareness while performing functional activities.  Baseline:  Goal status: INITIAL  4.  Patient will score < or = to 16 sec on 5 STS for improved functional mobility. Baseline: 19.63 sec Goal status: INITIAL   LONG TERM GOALS: Target date: 01/19/2024  Patient will demonstrate independence in advanced HEP. Baseline:  Goal status: INITIAL  2.  Patient will report > or = to 60% improvement in balance since starting PT. Baseline:  Goal status: INITIAL  3.  Patient will verbalize and demonstrate self-care strategies to manage pain including tissue mobility practices and change of position. Baseline:  Goal status: INITIAL   4.  Patient will score > or = 65/120 to on MCTISB for improved static balance and use of three systems involved in balance. Baseline: 59/120 Goal status: INITIAL  5.  Patient will  score < or = to 12 sec on TUG for decreased risk in falls. Baseline: 14.16 sec Goal status: INITIAL  6.  Patient will score < or = to 13 sec on 5 STS for improved functional mobility Baseline: 19.63 sec Goal status: INITIAL   PLAN:  PT FREQUENCY: 1-2x/week  PT DURATION: 8 weeks  PLANNED INTERVENTIONS: 97164- PT Re-evaluation, 97110-Therapeutic exercises, 97530- Therapeutic activity, [id number]- Neuromuscular re-education, 97535- Self Care, [id number]- Manual therapy, [id number]- Gait training, [phone]- Canalith repositioning, [id number]- Aquatic Therapy, [id number]- Vasopneumatic device, [id number]- Ultrasound, Patient/Family  education, Balance training, Stair training, Taping, Dry Needling, Vestibular training, Cryotherapy, and Moist heat  PLAN FOR NEXT SESSION: incorporating head turns ; postural strengthening; chin tucks; strengthening laying on foam roller  Earna Coder, PT, DPT 12/14/23 7:56 AM

## 2023-12-21 ENCOUNTER — Ambulatory Visit: Payer: Medicare HMO | Admitting: Physical Therapy

## 2023-12-21 DIAGNOSIS — D2239 Melanocytic nevi of other parts of face: Secondary | ICD-10-CM | POA: Diagnosis not present

## 2023-12-21 DIAGNOSIS — Z85828 Personal history of other malignant neoplasm of skin: Secondary | ICD-10-CM | POA: Diagnosis not present

## 2023-12-21 DIAGNOSIS — M6281 Muscle weakness (generalized): Secondary | ICD-10-CM | POA: Diagnosis not present

## 2023-12-21 DIAGNOSIS — R2681 Unsteadiness on feet: Secondary | ICD-10-CM | POA: Diagnosis not present

## 2023-12-21 DIAGNOSIS — R2689 Other abnormalities of gait and mobility: Secondary | ICD-10-CM | POA: Diagnosis not present

## 2023-12-21 DIAGNOSIS — R293 Abnormal posture: Secondary | ICD-10-CM

## 2023-12-21 DIAGNOSIS — M542 Cervicalgia: Secondary | ICD-10-CM | POA: Diagnosis not present

## 2023-12-21 DIAGNOSIS — R21 Rash and other nonspecific skin eruption: Secondary | ICD-10-CM | POA: Diagnosis not present

## 2023-12-21 NOTE — Therapy (Signed)
 OUTPATIENT PHYSICAL THERAPY CERVICAL / BALANCE TREATMENT   Patient Name: Alicia Bradley MRN: [id number] DOB:August 13, 1941, 83 y.o., female Today's Date: 12/21/2023  END OF SESSION:  PT End of Session - 12/21/23 0845     Visit Number 5    Date for PT Re-Evaluation 01/19/24    Authorization Type [employer]/ Medicare    Progress Note Due on Visit 10    PT Start Time 0730    PT Stop Time 0801    PT Time Calculation (min) 31 min    Activity Tolerance Patient tolerated treatment well    Behavior During Therapy [facility] for tasks assessed/performed                 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   GERD (gastroesophageal reflux disease)    Irritable bowel syndrome    Nocturia    OA (osteoarthritis)    Osteoporosis    Presence of pessary    mangaged by gyn-- dr Arelia Sneddon   Presence of surgical incision    03-28-2021   per pt had benign cyst removed from right arm above wrist 03-10-2021 still healing   Urgency incontinence    urologist-- dr winter   Past Surgical History:  Procedure Laterality Date   BLADDER SUSPENSION  2001   transvaginal sling and cystocele repair   BLEPHAROPLASTY W/ LASER Bilateral 12/2013   upper eyelids   BOTOX INJECTION N/A 04/02/2021   Procedure: BOTOX INJECTION WITH CYSTOSCOPY, 100 UNITS;  Surgeon: Rene Paci, MD;  Location: [facility];  Service: Urology;  Laterality: N/A;  ONLY NEEDS 30 MIN   CARPAL TUNNEL RELEASE Right 04/2009   AND RIGHT CUBITAL TUNNEL RELEASE   CATARACT EXTRACTION W/ INTRAOCULAR LENS IMPLANT Bilateral 2006   CESAREAN SECTION     x 2  last one 1972   COLONOSCOPY     last one 2010 approx.   CYSTOCELE REPAIR  2005   and vaginal correction   DILATION AND CURETTAGE OF UTERUS  1967   EXCISION/RELEASE BURSA HIP  11/25/2011   Procedure: EXCISION/RELEASE BURSA HIP;  Surgeon: Loanne Drilling, MD;  Location: [facility];  Service:  Orthopedics;  Laterality: Right;  Right Hip Bursectomy with Possible Tendon Repair   FINGER ARTHRODESIS Right 04/2016   right index finger joint   TONSILLECTOMY     child   Patient Active Problem List   Diagnosis Date Noted   Pain of left hip joint 04/05/2020   Throat discomfort 02/29/2020   Eustachian tube dysfunction, left 01/29/2020   Impacted cerumen of right ear 01/29/2020   Arthritis 10/26/2018   Irritable bowel syndrome 10/26/2018   Osteoporosis 10/26/2018   Urinary tract infectious disease 10/26/2018   Carpal tunnel syndrome on left 04/07/2016   Pain in finger of right hand 04/07/2016   Pain in soft tissues of limb 02/29/2016   Primary localized osteoarthrosis, hand 02/25/2016   Dermatochalasis of both upper eyelids 01/12/2014   Peripheral visual field defect 01/12/2014   Droopy eyelid 11/27/2013   Dry eye 11/27/2013   Glaucoma suspect of both eyes 11/27/2013   Other specified hypertrophic and atrophic condition of skin 11/27/2013   VFD (visual field defect) 11/27/2013   Trochanteric bursitis 11/25/2011   Congenital pancreatic cyst 08/11/2011    PCP: Cleatis Polka., MD  REFERRING PROVIDER: Cleatis Polka., MD  REFERRING DIAG: M54.2 (ICD-10-CM) -  Cervicalgia  THERAPY DIAG:  Unsteadiness on feet  Other abnormalities of gait and mobility  Cervicalgia  Muscle weakness (generalized)  Abnormal posture  Rationale for Evaluation and Treatment: Rehabilitation  ONSET DATE: Several Years  SUBJECTIVE:                                                                                                                                                                                                         SUBJECTIVE STATEMENT: Dizziness has not been an issue in the past week. She has noticed that the pain in her neck has been from muscle spasms. She has had a lot of stress due to her water heater breaking this week. She feels like she is independent with HEP and  would like to try exercises for a few weeks and then see if she needs to return.  From Eval: Patient report her equilibrium feels off.  She had a fall three weeks ago and had Xrays done and they came back unremarkable besides a hematoma.  See imaging results below. She feels the fluid is not draining from her Lt ear. She feels her left ear canal is different from her right. She does nose drops and she notices the difference in her ear canals. With every motion she feels off balance (sit to stand transfer, walking, supine to sit transfer). She only has neck pain when she turns a certain way (usually right cervical rotation). She would like to address balance and cervical pain. Hand dominance: Right  PERTINENT HISTORY:  Cervical stenosis; OA; osteoporosis; pessary;   PAIN:  Are you having pain?  She occasionally has right sided neck pain when she turns a certain way. Usually with cervical rotation. She feels a catch.  PRECAUTIONS: None  RED FLAGS: None    WEIGHT BEARING RESTRICTIONS: No  FALLS:  Has patient fallen in last 6 months? Yes. Number of falls 1 three weeks ago she fell while walking out of a store. She fell down a curb. 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  DIAGNOSTIC FINDINGS:  Head CT 11/04/2023 IMPRESSION: 1. No acute traumatic injury identified in the cervical spine. 2. 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 3.  Aortic Atherosclerosis (ICD10-I70.0)  IMPRESSION: 1. Left forehead, anterior convexity scalp hematoma. No underlying skull fracture. 2. No acute intracranial abnormality. Mild for age white matter changes most commonly due to small vessel disease. PATIENT SURVEYS:  ABC scale 72.5 % moderate level of physical functioning  COGNITION: Overall cognitive status: Within functional limits for tasks assessed  SENSATION: WFL  POSTURE: rounded shoulders, forward head, and increased thoracic kyphosis    CERVICAL ROM:   Active ROM A/PROM (deg) eval  Flexion 30  Extension 27  Right lateral flexion 30  Left lateral flexion 25  Right rotation 40  Left rotation 40   (Blank rows = not tested)    LOWER EXTREMITY ZOX:[id number] hip flexion 4-/5 other muscles grossly 4/5     FUNCTIONAL TESTS:  5 times sit to stand: 19.63 hands on thighs Timed up and go (TUG): 14.16 sec; decreased speed with turns MCTSIB: Condition 1: Avg of 3 trials: 16.09 sec, Condition 2: Avg of 3 trials: 8.59sec sec, Condition 3: Avg of 3 trials: 30 sec, Condition 4: Avg of 3 trials: 4.32 sec, and Total Score: 59/120 11/29/2023  DGI:16/24  TREATMENT DATE:  12/21/23: Manual: STM to bilateral upper traps for improved tissue mobility. Chin tuck stretch 2x50min  Levator trap stretch 2x23min  Forward/backward shoulder rolls 2x20  shoulder row with BlueTB 2x10  Shoulder extension with BlueTB 2x10  Shoulder horizontal abduction with BlueTB 2x10  12/14/2023 Manual: STM to bilateral upper traps for improved tissue mobility. Forward/backward shoulder rolls 2x20  Scapular squeezes 2x20  shoulder row with GTB 2x10  Shoulder extension with GTB 2x10  Shoulder horizontal abduction with GTB 2x10   12/07/2023 NuStep Level 5 5 mins- PT present to discuss status Standing marching 2 x 10 (1# ankle weights) Airex + 2" step ups fwd/lat 2x10 bilateral (1# ankle weights) Sit to  stand holding 3# dumbbell 2x10 - bilateral head turns added each way to last set  Shoulder horizontal abduction with red TB x 10 Shoulder extension with red TB x 10 Shoulder row with red TB x 10 Manual: STM to bilateral upper traps for improved tissue mobility.  11/29/2023 NuStep Level 5 5 mins- PT present to discuss status DGI 16/24 Education on results of DGI;  Standing marching 2 x 10 Sit to stand x 10 Shoulder horizontal abduction with red TB x 10 Shoulder extension with red TB x 10 Shoulder row with red TB x 10 Airex step ups x 10 bilateral  Airex lateral step ups x 10 Hand hold at counter diagonal head turns x 3 each direction & looking up and down x 3 (very challenging)  Y's at wall x 10 (patient got dizzy) Manual: STM to bilateral upper traps for improved tissue mobility.  11/24/2023 Initial Evaluation & HEP created  PATIENT EDUCATION:  Education details: POC; 3 systems involved in balance ; HEP Person educated: Patient Education method: Explanation, Demonstration, and Handouts Education comprehension: verbalized understanding, returned demonstration, and needs further education  HOME EXERCISE PROGRAM: Access Code: [email or web address] URL: [email or web address] Date: 11/24/2023 Prepared by: Claude Manges  Exercises - Standing March with Counter Support  - 1 x daily - 7 x weekly - 2 sets - 10 reps - Standing Heel Raise with Support  - 1 x daily - 7 x weekly - 2 sets - 10 reps - Sit to Stand  - 1 x daily - 7 x weekly - 1 sets - 10 reps - Shoulder External Rotation and Scapular Retraction with Resistance  - 1 x daily - 7 x weekly - 2 sets - 10 reps - Standing Shoulder Horizontal Abduction with Resistance  - 1 x daily - 7 x weekly - 2 sets - 10 reps - Shoulder extension with resistance - Neutral  - 1 x daily - 7 x weekly - 2 sets - 10 reps - Standing  Shoulder Row with Anchored Resistance  - 1 x daily - 7 x weekly - 2 sets - 10 reps  ASSESSMENT:  CLINICAL IMPRESSION: Alicia Bradley verbalized compliance with HEP. She experienced no dizziness since last visit, along with no dizziness throughout today's session. Neck muscle tension has been patient's biggest complaint with increasing resistance and home and caregiving for husband. Patient's right sided upper trap was much more tense and tender to palpation than left side. Pt tolerated an increase in all forms of resistance during today's exercises. Patient will benefit from skilled PT to address the below impairments and improve overall function.  OBJECTIVE IMPAIRMENTS: Abnormal gait, decreased balance, decreased strength, impaired flexibility, and postural dysfunction.   ACTIVITY LIMITATIONS: lifting, bending, squatting, transfers, bed mobility, and locomotion level  PARTICIPATION LIMITATIONS: cleaning, laundry, driving, shopping, and community activity  PERSONAL FACTORS: Age, Time since onset of injury/illness/exacerbation, and 1-2 comorbidities: osteoporosis  are also affecting patient's functional outcome.   REHAB POTENTIAL: Good  CLINICAL DECISION MAKING: Stable/uncomplicated  EVALUATION COMPLEXITY: Low   GOALS: Goals reviewed with patient? Yes  SHORT TERM GOALS: Target date: 12/22/2023  Patient will be independent with initial HEP. Baseline:  Goal status: GOAL MET 12/21/23  2.  Patient will report > or = to 30% improvement in balance since starting PT. Baseline:  Goal status:  GOAL MET 12/21/23  3.  Patient will demonstrate improved postural awareness while performing functional activities.  Baseline:  Goal status:  GOAL MET 12/21/23  4.  Patient will score < or = to 16 sec on 5 STS for improved functional mobility. Baseline: 19.63 sec Goal status:  GOAL MET 12/21/23   LONG TERM GOALS: Target date: 01/19/2024  Patient will demonstrate independence in advanced HEP. Baseline:   Goal status:  GOAL MET 12/21/23  2.  Patient will report > or = to 60% improvement in balance since starting PT. Baseline:  Goal status:  GOAL MET 12/21/23  3.  Patient will verbalize and demonstrate self-care strategies to manage pain including tissue mobility practices and change of position. Baseline:  Goal status:  GOAL MET 12/21/23   4.  Patient will score > or = 65/120 to on MCTISB for improved static balance and use of three systems involved in balance. Baseline: 59/120 Goal status: ONGOING 12/21/23  5.  Patient will score < or = to 12 sec on TUG for decreased risk in falls. Baseline: 14.16 sec Goal status: ONGOING 12/21/23  6.  Patient will score < or = to 13 sec on 5 STS for improved functional mobility Baseline: 19.63 sec Goal status: ONGOING 12/21/23   PLAN:  PT FREQUENCY: 1-2x/week  PT DURATION: 8 weeks  PLANNED INTERVENTIONS: 97164- PT Re-evaluation, 97110-Therapeutic exercises, 97530- Therapeutic activity, 97112- Neuromuscular re-education, 97535- Self Care, 78295- Manual therapy, [id number]- Gait training, [phone]- Canalith repositioning, [id number]- Aquatic Therapy, 97016- Vasopneumatic device, [id number]- Ultrasound, Patient/Family education, Balance training, Stair training, Taping, Dry Needling, Vestibular training, Cryotherapy, and Moist heat  PLAN FOR NEXT SESSION: incorporating head turns ; postural strengthening; chin tucks; strengthening laying on foam roller  Earna Coder, PT, DPT 12/21/23 8:45 AM

## 2023-12-22 DIAGNOSIS — N8182 Incompetence or weakening of pubocervical tissue: Secondary | ICD-10-CM | POA: Diagnosis not present

## 2023-12-28 DIAGNOSIS — M19071 Primary osteoarthritis, right ankle and foot: Secondary | ICD-10-CM | POA: Diagnosis not present

## 2024-01-04 DIAGNOSIS — Z4689 Encounter for fitting and adjustment of other specified devices: Secondary | ICD-10-CM | POA: Diagnosis not present

## 2024-01-04 DIAGNOSIS — N8182 Incompetence or weakening of pubocervical tissue: Secondary | ICD-10-CM | POA: Diagnosis not present

## 2024-01-05 ENCOUNTER — Telehealth: Payer: Self-pay | Admitting: Physical Therapy

## 2024-01-05 NOTE — Telephone Encounter (Signed)
  Patient wanted to get an appointment with Alicia Bradley has no openings till June but POC 01/19/2024. Called patient to inform her we will put her on waitlist for [facility] and she has no appt scheduled due to the POC date.

## 2024-01-12 ENCOUNTER — Other Ambulatory Visit (HOSPITAL_COMMUNITY): Payer: Self-pay | Admitting: *Deleted

## 2024-01-13 ENCOUNTER — Ambulatory Visit (HOSPITAL_COMMUNITY)
Admission: RE | Admit: 2024-01-13 | Discharge: 2024-01-13 | Disposition: A | Discharge status: Home / Self Care | Source: Ambulatory Visit | Attending: Internal Medicine | Admitting: Internal Medicine

## 2024-01-13 ENCOUNTER — Encounter (HOSPITAL_COMMUNITY)

## 2024-01-13 DIAGNOSIS — M810 Age-related osteoporosis without current pathological fracture: Secondary | ICD-10-CM | POA: Insufficient documentation

## 2024-01-13 MED ORDER — DENOSUMAB 60 MG/ML ~~LOC~~ SOSY: 60.0000 mg | PREFILLED_SYRINGE | Freq: Once | SUBCUTANEOUS | Status: AC

## 2024-01-13 MED ORDER — DENOSUMAB 60 MG/ML ~~LOC~~ SOSY
PREFILLED_SYRINGE | SUBCUTANEOUS | Status: AC
  Administered 2024-01-13: 60 mg via SUBCUTANEOUS
  Filled 2024-01-13: qty 1

## 2024-01-19 DIAGNOSIS — M47812 Spondylosis without myelopathy or radiculopathy, cervical region: Secondary | ICD-10-CM | POA: Diagnosis not present

## 2024-02-02 DIAGNOSIS — N8182 Incompetence or weakening of pubocervical tissue: Secondary | ICD-10-CM | POA: Diagnosis not present

## 2024-02-11 DIAGNOSIS — M47812 Spondylosis without myelopathy or radiculopathy, cervical region: Secondary | ICD-10-CM | POA: Diagnosis not present

## 2024-02-11 DIAGNOSIS — M7918 Myalgia, other site: Secondary | ICD-10-CM | POA: Diagnosis not present

## 2024-02-16 DIAGNOSIS — D1801 Hemangioma of skin and subcutaneous tissue: Secondary | ICD-10-CM | POA: Diagnosis not present

## 2024-02-16 DIAGNOSIS — Z85828 Personal history of other malignant neoplasm of skin: Secondary | ICD-10-CM | POA: Diagnosis not present

## 2024-02-16 DIAGNOSIS — L814 Other melanin hyperpigmentation: Secondary | ICD-10-CM | POA: Diagnosis not present

## 2024-02-16 DIAGNOSIS — D2261 Melanocytic nevi of right upper limb, including shoulder: Secondary | ICD-10-CM | POA: Diagnosis not present

## 2024-02-16 DIAGNOSIS — L821 Other seborrheic keratosis: Secondary | ICD-10-CM | POA: Diagnosis not present

## 2024-02-16 DIAGNOSIS — I788 Other diseases of capillaries: Secondary | ICD-10-CM | POA: Diagnosis not present

## 2024-02-16 DIAGNOSIS — L720 Epidermal cyst: Secondary | ICD-10-CM | POA: Diagnosis not present

## 2024-02-22 DIAGNOSIS — N8182 Incompetence or weakening of pubocervical tissue: Secondary | ICD-10-CM | POA: Diagnosis not present

## 2024-03-02 DIAGNOSIS — N3941 Urge incontinence: Secondary | ICD-10-CM | POA: Diagnosis not present

## 2024-03-07 DIAGNOSIS — M19071 Primary osteoarthritis, right ankle and foot: Secondary | ICD-10-CM | POA: Diagnosis not present

## 2024-03-15 ENCOUNTER — Telehealth: Payer: Self-pay | Admitting: Pharmacy Technician

## 2024-03-15 NOTE — Telephone Encounter (Signed)
 Auth Submission: APPROVED Site of care: Site of care: [facility] Payer: [employer] MEDICARE Medication & CPT/J Code(s) submitted: Prolia  (Denosumab ) [id number] Diagnosis Code: M81.0 Route of submission (phone, fax, portal):  Phone # Fax # Auth type: Buy/Bill PB Units/visits requested: 60MG  Q6M X2 Reference number: [id number] Approval from: 12/31/23 to 12/30/24

## 2024-03-29 DIAGNOSIS — H6123 Impacted cerumen, bilateral: Secondary | ICD-10-CM | POA: Diagnosis not present

## 2024-03-29 DIAGNOSIS — R42 Dizziness and giddiness: Secondary | ICD-10-CM | POA: Diagnosis not present

## 2024-03-29 DIAGNOSIS — R682 Dry mouth, unspecified: Secondary | ICD-10-CM | POA: Diagnosis not present

## 2024-03-29 DIAGNOSIS — F3340 Major depressive disorder, recurrent, in remission, unspecified: Secondary | ICD-10-CM | POA: Diagnosis not present

## 2024-03-29 DIAGNOSIS — R2681 Unsteadiness on feet: Secondary | ICD-10-CM | POA: Diagnosis not present

## 2024-04-03 DIAGNOSIS — R2681 Unsteadiness on feet: Secondary | ICD-10-CM | POA: Diagnosis not present

## 2024-04-03 DIAGNOSIS — H8110 Benign paroxysmal vertigo, unspecified ear: Secondary | ICD-10-CM | POA: Diagnosis not present

## 2024-04-03 DIAGNOSIS — R531 Weakness: Secondary | ICD-10-CM | POA: Diagnosis not present

## 2024-04-03 DIAGNOSIS — M810 Age-related osteoporosis without current pathological fracture: Secondary | ICD-10-CM | POA: Diagnosis not present

## 2024-04-04 DIAGNOSIS — H52223 Regular astigmatism, bilateral: Secondary | ICD-10-CM | POA: Diagnosis not present

## 2024-04-04 DIAGNOSIS — H524 Presbyopia: Secondary | ICD-10-CM | POA: Diagnosis not present

## 2024-04-04 DIAGNOSIS — H04123 Dry eye syndrome of bilateral lacrimal glands: Secondary | ICD-10-CM | POA: Diagnosis not present

## 2024-04-04 DIAGNOSIS — H40023 Open angle with borderline findings, high risk, bilateral: Secondary | ICD-10-CM | POA: Diagnosis not present

## 2024-04-04 DIAGNOSIS — H02035 Senile entropion of left lower eyelid: Secondary | ICD-10-CM | POA: Diagnosis not present

## 2024-04-04 DIAGNOSIS — H02132 Senile ectropion of right lower eyelid: Secondary | ICD-10-CM | POA: Diagnosis not present

## 2024-04-17 DIAGNOSIS — R002 Palpitations: Secondary | ICD-10-CM | POA: Diagnosis not present

## 2024-04-17 DIAGNOSIS — R682 Dry mouth, unspecified: Secondary | ICD-10-CM | POA: Diagnosis not present

## 2024-04-17 DIAGNOSIS — R2681 Unsteadiness on feet: Secondary | ICD-10-CM | POA: Diagnosis not present

## 2024-04-17 DIAGNOSIS — F419 Anxiety disorder, unspecified: Secondary | ICD-10-CM | POA: Diagnosis not present

## 2024-04-26 DIAGNOSIS — R682 Dry mouth, unspecified: Secondary | ICD-10-CM | POA: Diagnosis not present

## 2024-04-26 DIAGNOSIS — F419 Anxiety disorder, unspecified: Secondary | ICD-10-CM | POA: Diagnosis not present

## 2024-04-26 DIAGNOSIS — N8182 Incompetence or weakening of pubocervical tissue: Secondary | ICD-10-CM | POA: Diagnosis not present

## 2024-05-10 ENCOUNTER — Other Ambulatory Visit: Payer: Self-pay | Admitting: Internal Medicine

## 2024-05-10 DIAGNOSIS — Z1231 Encounter for screening mammogram for malignant neoplasm of breast: Secondary | ICD-10-CM

## 2024-05-10 DIAGNOSIS — N8182 Incompetence or weakening of pubocervical tissue: Secondary | ICD-10-CM | POA: Diagnosis not present

## 2024-05-15 DIAGNOSIS — E785 Hyperlipidemia, unspecified: Secondary | ICD-10-CM | POA: Diagnosis not present

## 2024-05-25 ENCOUNTER — Ambulatory Visit
Admission: RE | Admit: 2024-05-25 | Discharge: 2024-05-25 | Disposition: A | Discharge status: Home / Self Care | Source: Ambulatory Visit | Attending: Internal Medicine | Admitting: Internal Medicine

## 2024-05-25 DIAGNOSIS — Z1231 Encounter for screening mammogram for malignant neoplasm of breast: Secondary | ICD-10-CM

## 2024-05-30 DIAGNOSIS — M7918 Myalgia, other site: Secondary | ICD-10-CM | POA: Diagnosis not present

## 2024-06-09 DIAGNOSIS — M810 Age-related osteoporosis without current pathological fracture: Secondary | ICD-10-CM | POA: Insufficient documentation

## 2024-06-21 DIAGNOSIS — N898 Other specified noninflammatory disorders of vagina: Secondary | ICD-10-CM | POA: Diagnosis not present

## 2024-06-21 DIAGNOSIS — Z4689 Encounter for fitting and adjustment of other specified devices: Secondary | ICD-10-CM | POA: Diagnosis not present

## 2024-06-21 DIAGNOSIS — N771 Vaginitis, vulvitis and vulvovaginitis in diseases classified elsewhere: Secondary | ICD-10-CM | POA: Diagnosis not present

## 2024-06-27 DIAGNOSIS — H02054 Trichiasis without entropian left upper eyelid: Secondary | ICD-10-CM | POA: Diagnosis not present

## 2024-07-12 DIAGNOSIS — N950 Postmenopausal bleeding: Secondary | ICD-10-CM | POA: Diagnosis not present

## 2024-07-19 ENCOUNTER — Ambulatory Visit (HOSPITAL_COMMUNITY)
Admission: RE | Admit: 2024-07-19 | Discharge: 2024-07-19 | Disposition: A | Discharge status: Home / Self Care | Source: Ambulatory Visit | Attending: Internal Medicine | Admitting: Internal Medicine

## 2024-07-19 VITALS — BP 113/73 | HR 83 | Temp 97.8°F | Resp 18

## 2024-07-19 DIAGNOSIS — M810 Age-related osteoporosis without current pathological fracture: Secondary | ICD-10-CM | POA: Diagnosis not present

## 2024-07-19 MED ORDER — DENOSUMAB 60 MG/ML ~~LOC~~ SOSY
PREFILLED_SYRINGE | SUBCUTANEOUS | Status: AC
  Filled 2024-07-19: qty 1

## 2024-07-19 MED ORDER — DENOSUMAB 60 MG/ML ~~LOC~~ SOSY
60.0000 mg | PREFILLED_SYRINGE | Freq: Once | SUBCUTANEOUS | Status: AC
  Administered 2024-07-19: 60 mg via SUBCUTANEOUS

## 2024-08-04 DIAGNOSIS — R351 Nocturia: Secondary | ICD-10-CM | POA: Diagnosis not present

## 2024-08-04 DIAGNOSIS — N3281 Overactive bladder: Secondary | ICD-10-CM | POA: Diagnosis not present

## 2024-08-09 DIAGNOSIS — N398 Other specified disorders of urinary system: Secondary | ICD-10-CM | POA: Diagnosis not present

## 2024-08-09 DIAGNOSIS — N3941 Urge incontinence: Secondary | ICD-10-CM | POA: Diagnosis not present

## 2024-08-09 DIAGNOSIS — R351 Nocturia: Secondary | ICD-10-CM | POA: Diagnosis not present

## 2024-08-29 DIAGNOSIS — N840 Polyp of corpus uteri: Secondary | ICD-10-CM | POA: Diagnosis not present

## 2024-08-31 DIAGNOSIS — L718 Other rosacea: Secondary | ICD-10-CM | POA: Diagnosis not present

## 2024-08-31 DIAGNOSIS — I788 Other diseases of capillaries: Secondary | ICD-10-CM | POA: Diagnosis not present

## 2024-08-31 DIAGNOSIS — Z85828 Personal history of other malignant neoplasm of skin: Secondary | ICD-10-CM | POA: Diagnosis not present

## 2024-09-05 DIAGNOSIS — M19042 Primary osteoarthritis, left hand: Secondary | ICD-10-CM | POA: Diagnosis not present

## 2024-09-05 DIAGNOSIS — S39012A Strain of muscle, fascia and tendon of lower back, initial encounter: Secondary | ICD-10-CM | POA: Diagnosis not present

## 2024-09-05 DIAGNOSIS — M4186 Other forms of scoliosis, lumbar region: Secondary | ICD-10-CM | POA: Diagnosis not present

## 2024-09-18 ENCOUNTER — Other Ambulatory Visit: Payer: Self-pay | Admitting: Orthopedic Surgery

## 2024-10-25 ENCOUNTER — Encounter (HOSPITAL_BASED_OUTPATIENT_CLINIC_OR_DEPARTMENT_OTHER): Payer: Self-pay | Admitting: Orthopedic Surgery

## 2024-10-29 ENCOUNTER — Encounter (HOSPITAL_BASED_OUTPATIENT_CLINIC_OR_DEPARTMENT_OTHER): Payer: Self-pay | Admitting: Orthopedic Surgery

## 2024-10-29 NOTE — Anesthesia Preprocedure Evaluation (Signed)
"                                    Anesthesia Evaluation  Patient identified by MRN, date of birth, ID band Patient awake    Reviewed: Allergy & Precautions, NPO status , Patient's Chart, lab work & pertinent test results  Airway Mallampati: II  TM Distance: >3 FB     Dental no notable dental hx. (+) Teeth Intact, Dental Advisory Given   Pulmonary neg pulmonary ROS   Pulmonary exam normal breath sounds clear to auscultation       Cardiovascular negative cardio ROS Normal cardiovascular exam Rhythm:Regular Rate:Normal     Neuro/Psych Visual field defect Glaucoma  Neuromuscular disease  negative psych ROS   GI/Hepatic Neg liver ROS,GERD  ,,IBS   Endo/Other  negative endocrine ROS    Renal/GU negative Renal ROS   negative genitourinary   Musculoskeletal  (+) Arthritis , Osteoarthritis,  Left index finger arthritis Osteoporosis   Abdominal   Peds  Hematology negative hematology ROS (+)   Anesthesia Other Findings   Reproductive/Obstetrics                              Anesthesia Physical Anesthesia Plan  ASA: 2  Anesthesia Plan: MAC and Regional   Post-op Pain Management: Regional block* and Minimal or no pain anticipated   Induction: Intravenous  PONV Risk Score and Plan: Propofol  infusion and Treatment may vary due to age or medical condition  Airway Management Planned: Simple Face Mask, Natural Airway and Nasal Cannula  Additional Equipment: None  Intra-op Plan:   Post-operative Plan:   Informed Consent: I have reviewed the patients History and Physical, chart, labs and discussed the procedure including the risks, benefits and alternatives for the proposed anesthesia with the patient or authorized representative who has indicated his/her understanding and acceptance.     Dental advisory given  Plan Discussed with: CRNA and Anesthesiologist  Anesthesia Plan Comments:          Anesthesia Quick  Evaluation  "

## 2024-10-31 ENCOUNTER — Encounter (HOSPITAL_BASED_OUTPATIENT_CLINIC_OR_DEPARTMENT_OTHER): Payer: Self-pay | Admitting: Orthopedic Surgery

## 2024-10-31 ENCOUNTER — Encounter (HOSPITAL_BASED_OUTPATIENT_CLINIC_OR_DEPARTMENT_OTHER): Payer: Self-pay | Admitting: Anesthesiology

## 2024-10-31 ENCOUNTER — Ambulatory Visit (HOSPITAL_BASED_OUTPATIENT_CLINIC_OR_DEPARTMENT_OTHER)

## 2024-10-31 ENCOUNTER — Other Ambulatory Visit: Payer: Self-pay

## 2024-10-31 ENCOUNTER — Encounter (HOSPITAL_BASED_OUTPATIENT_CLINIC_OR_DEPARTMENT_OTHER)
Admission: RE | Disposition: A | Discharge status: Home / Self Care | Payer: Self-pay | Source: Home / Self Care | Attending: Orthopedic Surgery

## 2024-10-31 ENCOUNTER — Ambulatory Visit (HOSPITAL_BASED_OUTPATIENT_CLINIC_OR_DEPARTMENT_OTHER)
Admission: RE | Admit: 2024-10-31 | Discharge: 2024-10-31 | Disposition: A | Discharge status: Home / Self Care | Attending: Orthopedic Surgery | Admitting: Orthopedic Surgery

## 2024-10-31 DIAGNOSIS — M19042 Primary osteoarthritis, left hand: Secondary | ICD-10-CM | POA: Insufficient documentation

## 2024-10-31 DIAGNOSIS — M810 Age-related osteoporosis without current pathological fracture: Secondary | ICD-10-CM | POA: Insufficient documentation

## 2024-10-31 DIAGNOSIS — K589 Irritable bowel syndrome without diarrhea: Secondary | ICD-10-CM | POA: Insufficient documentation

## 2024-10-31 DIAGNOSIS — Z01818 Encounter for other preprocedural examination: Secondary | ICD-10-CM

## 2024-10-31 DIAGNOSIS — K219 Gastro-esophageal reflux disease without esophagitis: Secondary | ICD-10-CM | POA: Insufficient documentation

## 2024-10-31 MED ORDER — PROPOFOL 500 MG/50ML IV EMUL
INTRAVENOUS | Status: DC | PRN
  Administered 2024-10-31: 50 ug/kg/min via INTRAVENOUS

## 2024-10-31 MED ORDER — ONDANSETRON HCL 4 MG/2ML IJ SOLN: 4.0000 mg | Freq: Once | INTRAMUSCULAR | Status: DC | PRN

## 2024-10-31 MED ORDER — FENTANYL CITRATE (PF) 100 MCG/2ML IJ SOLN
INTRAMUSCULAR | Status: AC
  Filled 2024-10-31: qty 2

## 2024-10-31 MED ORDER — FENTANYL CITRATE (PF) 100 MCG/2ML IJ SOLN: 25.0000 ug | INTRAMUSCULAR | Status: DC | PRN

## 2024-10-31 MED ORDER — OXYCODONE HCL 5 MG PO TABS: 5.0000 mg | ORAL_TABLET | Freq: Once | ORAL | Status: DC | PRN

## 2024-10-31 MED ORDER — LACTATED RINGERS IV SOLN: INTRAVENOUS | Status: DC | PRN

## 2024-10-31 MED ORDER — MIDAZOLAM HCL 2 MG/2ML IJ SOLN
INTRAMUSCULAR | Status: AC
  Filled 2024-10-31: qty 2

## 2024-10-31 MED ORDER — LACTATED RINGERS IV SOLN: INTRAVENOUS | Status: DC

## 2024-10-31 MED ORDER — ONDANSETRON HCL 4 MG/2ML IJ SOLN
INTRAMUSCULAR | Status: DC | PRN
  Administered 2024-10-31: 4 mg via INTRAVENOUS

## 2024-10-31 MED ORDER — HYDROCODONE-ACETAMINOPHEN 5-325 MG PO TABS: 1.0000 | ORAL_TABLET | Freq: Four times a day (QID) | ORAL | 0 refills | Status: AC | PRN

## 2024-10-31 MED ORDER — CEFAZOLIN SODIUM-DEXTROSE 2-4 GM/100ML-% IV SOLN
INTRAVENOUS | Status: AC
  Filled 2024-10-31: qty 100

## 2024-10-31 MED ORDER — ROPIVACAINE HCL 5 MG/ML IJ SOLN
INTRAMUSCULAR | Status: DC | PRN
  Administered 2024-10-31: 25 mL via PERINEURAL

## 2024-10-31 MED ORDER — CEFAZOLIN SODIUM-DEXTROSE 2-4 GM/100ML-% IV SOLN
2.0000 g | INTRAVENOUS | Status: AC
  Administered 2024-10-31: 2 g via INTRAVENOUS

## 2024-10-31 MED ORDER — FENTANYL CITRATE (PF) 100 MCG/2ML IJ SOLN
100.0000 ug | Freq: Once | INTRAMUSCULAR | Status: AC
  Administered 2024-10-31: 100 ug via INTRAVENOUS

## 2024-10-31 MED ORDER — OXYCODONE HCL 5 MG/5ML PO SOLN: 5.0000 mg | Freq: Once | ORAL | Status: DC | PRN

## 2024-10-31 MED ORDER — FENTANYL CITRATE (PF) 100 MCG/2ML IJ SOLN: 50.0000 ug | Freq: Once | INTRAMUSCULAR | Status: DC

## 2024-10-31 NOTE — Anesthesia Postprocedure Evaluation (Signed)
"   Anesthesia Post Note  Patient: Alicia Bradley [facility]  Procedure(s) Performed: LEFT INDEX FINGER DISTAL INTERPHALANGEAL JOINT FUSION (Left: Index Finger)     Patient location during evaluation: PACU Anesthesia Type: Regional and MAC Level of consciousness: awake and alert Pain management: pain level controlled Vital Signs Assessment: post-procedure vital signs reviewed and stable Respiratory status: spontaneous breathing, nonlabored ventilation and respiratory function stable Cardiovascular status: stable and blood pressure returned to baseline Postop Assessment: no apparent nausea or vomiting Anesthetic complications: no   No notable events documented.  Last Vitals:  Vitals:   10/31/24 1301 10/31/24 1415  BP: (!) 148/66 (!) 141/69  Pulse: 70 80  Resp: (!) 22 19  Temp:  (!) 36.4 C  SpO2: 98% 92%    Last Pain:  Vitals:   10/31/24 1415  TempSrc:   PainSc: 0-No pain                 Zayda Angell A.      "

## 2024-10-31 NOTE — Transfer of Care (Signed)
 Immediate Anesthesia Transfer of Care Note  Patient: [facility]  Procedure(s) Performed: LEFT INDEX FINGER DISTAL INTERPHALANGEAL JOINT FUSION (Left: Index Finger)  Patient Location: PACU  Anesthesia Type:MAC and Regional  Level of Consciousness: awake, alert , and patient cooperative  Airway & Oxygen Therapy: Patient Spontanous Breathing  Post-op Assessment: Report given to RN and Post -op Vital signs reviewed and stable  Post vital signs: Reviewed and stable  Last Vitals:  Vitals Value Taken Time  BP 141/69 10/31/24 14:14  Temp    Pulse 80 10/31/24 14:14  Resp 19 10/31/24 14:14  SpO2 91 % 10/31/24 14:14  Vitals shown include unfiled device data.  Last Pain:  Vitals:   10/31/24 1103  TempSrc: Temporal  PainSc: 0-No pain      Patients Stated Pain Goal: 0 (10/31/24 1103)  Complications: No notable events documented.

## 2024-10-31 NOTE — Discharge Instructions (Addendum)
 [facility] Center Instructions Hand Surgery  Wound Care: Keep your hand elevated above the level of your heart.  Do not allow it to dangle by your side.  Keep the dressing dry and do not remove it unless your doctor advises you to do so.  He will usually change it at the time of your post-op visit.  Moving your fingers is advised to stimulate circulation but will depend on the site of your surgery.  If you have a splint applied, your doctor will advise you regarding movement.  Activity: Do not drive or operate machinery today.  Rest today and then you may return to your normal activity and work as indicated by your physician.  Diet:  Drink liquids today or eat a light diet.  You may resume a regular diet tomorrow.    General expectations: Pain for two to three days. Fingers may become slightly swollen.  Call your doctor if any of the following occur: Severe pain not relieved by pain medication. Elevated temperature. Dressing soaked with blood. Inability to move fingers. White or bluish color to fingers.    Post Anesthesia Home Care Instructions  Activity: Get plenty of rest for the remainder of the day. A responsible individual must stay with you for 24 hours following the procedure.  For the next 24 hours, DO NOT: -Drive a car -Advertising copywriter -Drink alcoholic beverages -Take any medication unless instructed by your physician -Make any legal decisions or sign important papers.  Meals: Start with liquid foods such as gelatin or soup. Progress to regular foods as tolerated. Avoid greasy, spicy, heavy foods. If nausea and/or vomiting occur, drink only clear liquids until the nausea and/or vomiting subsides. Call your physician if vomiting continues.  Special Instructions/Symptoms: Your throat may feel dry or sore from the anesthesia or the breathing tube placed in your throat during surgery. If this causes discomfort, gargle with warm salt water. The discomfort should disappear  within 24 hours.  If you had a scopolamine patch placed behind your ear for the management of post- operative nausea and/or vomiting:  1. The medication in the patch is effective for 72 hours, after which it should be removed.  Wrap patch in a tissue and discard in the trash. Wash hands thoroughly with soap and water. 2. You may remove the patch earlier than 72 hours if you experience unpleasant side effects which may include dry mouth, dizziness or visual disturbances. 3. Avoid touching the patch. Wash your hands with soap and water after contact with the patch.     Regional Anesthesia Blocks  1. You may not be able to move or feel the "blocked" extremity after a regional anesthetic block. This may last may last from 3-48 hours after placement, but it will go away. The length of time depends on the medication injected and your individual response to the medication. As the nerves start to wake up, you may experience tingling as the movement and feeling returns to your extremity. If the numbness and inability to move your extremity has not gone away after 48 hours, please call your surgeon.   2. The extremity that is blocked will need to be protected until the numbness is gone and the strength has returned. Because you cannot feel it, you will need to take extra care to avoid injury. Because it may be weak, you may have difficulty moving it or using it. You may not know what position it is in without looking at it while the block is in  effect.  3. For blocks in the legs and feet, returning to weight bearing and walking needs to be done carefully. You will need to wait until the numbness is entirely gone and the strength has returned. You should be able to move your leg and foot normally before you try and bear weight or walk. You will need someone to be with you when you first try to ensure you do not fall and possibly risk injury.  4. Bruising and tenderness at the needle site are common side  effects and will resolve in a few days.  5. Persistent numbness or new problems with movement should be communicated to the surgeon or the [facility] [phone] [facility] [phone]).

## 2024-10-31 NOTE — Anesthesia Procedure Notes (Signed)
 Anesthesia Regional Block: Supraclavicular block   Pre-Anesthetic Checklist: , timeout performed,  Correct Patient, Correct Site, Correct Laterality,  Correct Procedure, Correct Position, site marked,  Risks and benefits discussed,  Surgical consent,  Pre-op evaluation,  At surgeon's request and post-op pain management  Laterality: Left  Prep: chloraprep       Needles:  Injection technique: Single-shot  Needle Type: Echogenic Stimulator Needle     Needle Length: 10cm  Needle Gauge: 21   Needle insertion depth (cm): 4   Additional Needles:   Procedures:,,,, ultrasound used (permanent image in chart),,   Motor weakness within 5 minutes.  Narrative:  Start time: 10/31/2024 12:56 PM End time: 10/31/2024 1:01 PM Injection made incrementally with aspirations every 5 mL.  Performed by: Personally  Anesthesiologist: Jerrye Sharper, MD  Additional Notes: Timeout performed. Patient sedated. Relevant anatomy ID'd using US . Incremental 2-5ml injection of LA with frequent aspiration. Patient tolerated procedure well.

## 2024-10-31 NOTE — Progress Notes (Signed)
Assisted Dr. Foster with left, supraclavicular, ultrasound guided block. Side rails up, monitors on throughout procedure. See vital signs in flow sheet. Tolerated Procedure well. 

## 2024-10-31 NOTE — Op Note (Signed)
 NAME: Alicia Bradley [facility] MEDICAL RECORD NO: [id number] DATE OF BIRTH: 1941/03/09 FACILITY: [facility] LOCATION: [facility] PHYSICIAN: Alicia Alcaraz R. 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   ASSISTANT: Alicia Curia, MD   ANESTHESIA:  Regional with sedation   INTRAVENOUS FLUIDS:  Per anesthesia flow sheet.   ESTIMATED BLOOD LOSS:  Minimal.   COMPLICATIONS:  None.   SPECIMENS:  none   TOURNIQUET TIME:    Total Tourniquet Time Documented: Upper Arm (Left) - 39 minutes Total: Upper Arm (Left) - 39 minutes    DISPOSITION:  Stable to PACU.   INDICATIONS: 84 year old female with left index finger DIP joint arthritis.  Is painful and bothersome.  She wishes to have left index finger DIP joint fusion.  Risks, benefits and alternatives of surgery were discussed including the risks of blood loss, infection, damage to nerves, vessels, tendons, ligaments, bone for surgery, need for additional surgery, complications with wound healing, continued pain, stiffness, , nonunion, malunion.  She voiced understanding of these risks and elected to proceed.  OPERATIVE COURSE:  After being identified preoperatively by myself,  the patient and I agreed on the procedure and site of the procedure.  The surgical site was marked.  Surgical consent had been signed. Preoperative IV antibiotic prophylaxis was given. She was transferred to the operating room and placed on the operating table in supine position with the left upper extremity on an arm board.  Sedation was induced by the anesthesiologist. A regional block had been performed by anesthesia in preoperative holding.    Left upper extremity was prepped and draped in normal sterile orthopedic fashion.  A surgical pause was performed between the  surgeons, anesthesia, and operating room staff and all were in agreement as to the patient, procedure, and site of procedure.  Tourniquet at the proximal aspect of the extremity was inflated to 250 mmHg after exsanguination of the arm with an Esmarch bandage.  A curvilinear incision was made over the DIP joint of the left index finger dorsally.  This was carried in subcutaneous tissue.  Bipolar electrocautery was used to obtain hemostasis.  The extensor tendon was sharply incised osteophytes at the dorsum of the joint was removed with rongeurs.  The collateral ligaments were released.  The joint was opened.  Rongeurs were used to take down the subchondral bone of the middle phalanx to medullary bone.  K wires were used to drill holes in the subchondral bone of the distal phalanx.  The rongeurs were then used to take down subchondral bone down to medullary bone.  Guidepin was then advanced antegrade through the distal phalanx and down the tip of the finger.  This was then advanced retrograde into the distal aspect of the middle phalanx.  C-arm was used in AP and lateral projections to ensure appropriate positioning.  The pin was adjusted until appropriate relation was obtained.  Was then advanced into the middle phalanx.  Incision was made around the tip of the finger.  Guidepin was measured using a measuring guide.  A 22 mm screw was selected.  This was advanced over the guidepin.  C-arm was used in AP and lateral projections to ensure compression at the DIP joint and positioning of the screw which was the case.  There was  a copious irrigated with sterile saline.  The dorsal was with 4-0 nylon in a horizontal mattress fashion.  The wound at the tip finger was closed with a 4-0 chromic suture.  Wounds were dressed with sterile Xeroform and 4 x 4 and wrapped with Coban dressing lightly.  AlumaFoam Southers placed and wrapped lightly with Coban dressing.  Tourniquet was deflated at 39 minutes.  Fingertips were pink  with brisk capillary refill after deflation of tourniquet.  The operative  drapes were broken down.  The patient was awoken from anesthesia safely.  She was transferred back to the stretcher and taken to PACU in stable condition.  I will see her back in the office in 1 week for postoperative followup.  I will give her a prescription for Norco 5/325 1 tab PO q6 hours prn pain, dispense #15.   Alicia Lasker, MD Electronically signed, 10/31/24

## 2024-11-03 ENCOUNTER — Encounter (HOSPITAL_BASED_OUTPATIENT_CLINIC_OR_DEPARTMENT_OTHER): Payer: Self-pay | Admitting: Orthopedic Surgery

## 2025-01-18 ENCOUNTER — Encounter (HOSPITAL_COMMUNITY)
